# Patient Record
Sex: Female | Born: 1970 | Race: White | Hispanic: No | Marital: Married | Smoking: Never smoker
Health system: Southern US, Community
[De-identification: ages and names within clinical notes are randomized; demographics above are authoritative.]

## PROBLEM LIST (undated history)

## (undated) DIAGNOSIS — I10 Essential (primary) hypertension: Secondary | ICD-10-CM

## (undated) DIAGNOSIS — IMO0002 Reserved for concepts with insufficient information to code with codable children: Secondary | ICD-10-CM

## (undated) DIAGNOSIS — R87619 Unspecified abnormal cytological findings in specimens from cervix uteri: Secondary | ICD-10-CM

## (undated) HISTORY — PX: EYE SURGERY: SHX253

## (undated) HISTORY — DX: Unspecified abnormal cytological findings in specimens from cervix uteri: R87.619

## (undated) HISTORY — PX: CERVIX LESION DESTRUCTION: SHX591

## (undated) HISTORY — DX: Essential (primary) hypertension: I10

## (undated) HISTORY — DX: Reserved for concepts with insufficient information to code with codable children: IMO0002

---

## 1999-04-09 ENCOUNTER — Other Ambulatory Visit: Admission: RE | Admit: 1999-04-09 | Discharge: 1999-04-09 | Payer: Self-pay | Admitting: Family Medicine

## 2000-07-26 ENCOUNTER — Other Ambulatory Visit: Admission: RE | Admit: 2000-07-26 | Discharge: 2000-07-26 | Payer: Self-pay | Admitting: Family Medicine

## 2001-12-05 ENCOUNTER — Other Ambulatory Visit: Admission: RE | Admit: 2001-12-05 | Discharge: 2001-12-05 | Payer: Self-pay | Admitting: Family Medicine

## 2002-12-04 ENCOUNTER — Other Ambulatory Visit: Admission: RE | Admit: 2002-12-04 | Discharge: 2002-12-04 | Payer: Self-pay | Admitting: Internal Medicine

## 2003-11-29 ENCOUNTER — Other Ambulatory Visit: Admission: RE | Admit: 2003-11-29 | Discharge: 2003-11-29 | Payer: Self-pay | Admitting: Family Medicine

## 2004-12-17 ENCOUNTER — Other Ambulatory Visit: Admission: RE | Admit: 2004-12-17 | Discharge: 2004-12-17 | Payer: Self-pay | Admitting: Family Medicine

## 2005-12-21 ENCOUNTER — Other Ambulatory Visit: Admission: RE | Admit: 2005-12-21 | Discharge: 2005-12-21 | Payer: Self-pay | Admitting: Family Medicine

## 2010-07-20 ENCOUNTER — Ambulatory Visit (HOSPITAL_COMMUNITY)
Admission: RE | Admit: 2010-07-20 | Discharge: 2010-07-20 | Disposition: A | Discharge status: Home / Self Care | Payer: BC Managed Care – PPO | Source: Ambulatory Visit | Attending: Obstetrics and Gynecology | Admitting: Obstetrics and Gynecology

## 2010-07-20 DIAGNOSIS — N906 Unspecified hypertrophy of vulva: Secondary | ICD-10-CM | POA: Insufficient documentation

## 2010-07-20 HISTORY — PX: VULVA SURGERY: SHX837

## 2010-07-20 LAB — HCG, SERUM, QUALITATIVE: Preg, Serum: NEGATIVE

## 2010-07-20 LAB — CBC
MCH: 31.5 pg (ref 26.0–34.0)
MCV: 94.4 fL (ref 78.0–100.0)
Platelets: 362 10*3/uL (ref 150–400)
RBC: 4.66 MIL/uL (ref 3.87–5.11)
RDW: 12.5 % (ref 11.5–15.5)
WBC: 6.2 10*3/uL (ref 4.0–10.5)

## 2010-08-04 NOTE — Op Note (Signed)
  [name], [name]              ACCOUNT NO.:  [id number]  MEDICAL RECORD NO.:  [id number]           PATIENT TYPE:  O  LOCATION:  [facility]                          [facility]  PHYSICIAN:  Cynthia P. Romine, M.D.DATE OF BIRTH:  [birth year]  DATE OF PROCEDURE:  07/20/2010 DATE OF DISCHARGE:                              OPERATIVE REPORT   PREOPERATIVE DIAGNOSIS:  Bilateral hypertrophy of labia minora.  POSTOPERATIVE DIAGNOSIS:  Bilateral hypertrophy of labia minora.  PROCEDURE:  Revision of bilateral labia minora.  SURGEON:  Cynthia P. Romine, MD  ANESTHESIA:  General by LMA.  ESTIMATED BLOOD LOSS:  Minimal.  COMPLICATIONS:  None.  PROCEDURE:  The patient was taken to the operating room and after the induction of adequate general anesthesia by LMA, she was placed in dorsal lithotomy position and prepped and draped in the usual fashion. As she had emptied her bladder just before going back to the OR, a Foley catheter was not used.  In the holding room with the patient awake and before any medication was given to her, the surgeon marked the labia with a marking pen, where they were going to be excised and the patient agreed to the markings.  In the operating room after the prep and drape, the labia infiltrated at the incision site with 1% Xylocaine with epinephrine.  Mayo scissors were then used to excise along the lines and the patient and I had agreed upon preoperatively to reduce the size of the labia.  The right labia required significant reduction approximately 5 cm of length.  The labia was removed from the right.  On the left, it was more like 1-1/2 cm.  The incision lines were closed with a subcuticular stitch of 4-0 chromic.  Neosporin ointment was then placed over the incision line and the procedure was terminated.  The patient tolerated it well and went in satisfactory condition to postanesthesia recovery.     Cynthia P. Romine, M.D.     CPR/MEDQ  D:   07/20/2010  T:  07/21/2010  Job:  [id number]  Electronically Signed by Meredeth Ide M.D. on 08/04/2010 01:57:07 PM

## 2013-01-15 ENCOUNTER — Telehealth: Payer: Self-pay | Admitting: Certified Nurse Midwife

## 2013-01-15 ENCOUNTER — Other Ambulatory Visit: Payer: Self-pay | Admitting: Nurse Practitioner

## 2013-01-15 MED ORDER — NORGESTIM-ETH ESTRAD TRIPHASIC 0.18/0.215/0.25 MG-35 MCG PO TABS: 1.0000 | ORAL_TABLET | Freq: Every day | ORAL | Status: DC

## 2013-01-15 NOTE — Telephone Encounter (Signed)
Patient needs refill on birth control pills called in to [facility], [place] [phone]).  Currently on sugar pills in pack.

## 2013-01-15 NOTE — Telephone Encounter (Signed)
Patient is needing refills for her Tri Previfem BC to last her until her appointment 02/09/13 with Ms. Debbie.   Tri-Previfem # 1 pack sent through to [facility] patient is aware.

## 2013-01-15 NOTE — Telephone Encounter (Signed)
Patient has AEX 02/09/13 # 28/0rf's sent through to last patient.

## 2013-02-09 ENCOUNTER — Encounter: Payer: Self-pay | Admitting: Certified Nurse Midwife

## 2013-02-09 ENCOUNTER — Ambulatory Visit (INDEPENDENT_AMBULATORY_CARE_PROVIDER_SITE_OTHER): Payer: BC Managed Care – PPO | Admitting: Certified Nurse Midwife

## 2013-02-09 VITALS — BP 110/62 | HR 64 | Resp 16 | Ht 65.25 in | Wt 166.0 lb

## 2013-02-09 DIAGNOSIS — F5102 Adjustment insomnia: Secondary | ICD-10-CM

## 2013-02-09 DIAGNOSIS — Z0000 Encounter for general adult medical examination without abnormal findings: Secondary | ICD-10-CM

## 2013-02-09 DIAGNOSIS — Z309 Encounter for contraceptive management, unspecified: Secondary | ICD-10-CM

## 2013-02-09 DIAGNOSIS — Z01419 Encounter for gynecological examination (general) (routine) without abnormal findings: Secondary | ICD-10-CM

## 2013-02-09 LAB — POCT URINALYSIS DIPSTICK
Blood, UA: NEGATIVE
Glucose, UA: NEGATIVE
Nitrite, UA: NEGATIVE
Protein, UA: NEGATIVE
Urobilinogen, UA: NEGATIVE

## 2013-02-09 LAB — TSH: TSH: 1.448 u[IU]/mL (ref 0.350–4.500)

## 2013-02-09 MED ORDER — ZOLPIDEM TARTRATE 10 MG PO TABS: 10.0000 mg | ORAL_TABLET | Freq: Every evening | ORAL | Status: DC | PRN

## 2013-02-09 MED ORDER — NORGESTIM-ETH ESTRAD TRIPHASIC 0.18/0.215/0.25 MG-35 MCG PO TABS: 1.0000 | ORAL_TABLET | Freq: Every day | ORAL | Status: DC

## 2013-02-09 NOTE — Patient Instructions (Signed)

## 2013-02-09 NOTE — Progress Notes (Signed)
42 y.o. [id number] Married Caucasian Fe here for annual exam.  Periods normal with spotting 1-2 day prior to period one time only. Contraception working well. Traveled to [place] over the summer! Used the Ambien for sleep during travel, worked well. Needs refill for next trip. Experiences some right shoulder and neck pain since job position and extra hours. Feels it is work or lifting, plans to see PCP if not resolving. No other issues today Sees PCP for aex, labs every 2 years. All normal per patient in the past.  Patient's last menstrual period was 01/15/2013.          Sexually active: yes  The current method of family planning is OCP (estrogen/progesterone).    Exercising: yes  T25, Zumba & step Smoker:  no  Health Maintenance: Pap:  01-28-12 neg MMG:  5/14, f/u 6/14 rt breast negative Colonoscopy:  none BMD:   none TDaP:  2007 Labs: Poct urine-neg, Hgb-14.2 Self breast exam: done occ   reports that she has never smoked. She does not have any smokeless tobacco history on file. She reports that she does not drink alcohol or use illicit drugs.  Past Medical History  Diagnosis Date  . Abnormal Pap smear     Past Surgical History  Procedure Laterality Date  . Cervix lesion destruction      ?  Marland Kitchen Eye surgery    . Vulva surgery  07/20/10    bilateral revision labia minora    Current Outpatient Prescriptions  Medication Sig Dispense Refill  . Norgestimate-Ethinyl Estradiol Triphasic (TRI-PREVIFEM) 0.18/0.215/0.25 MG-35 MCG tablet Take 1 tablet by mouth daily.       No current facility-administered medications for this visit.    Family History  Problem Relation Age of Onset  . Heart disease Mother   . Heart disease Maternal Grandmother     pacemaker  . Stroke Maternal Grandmother   . Diabetes Maternal Aunt   . Cancer Paternal Grandfather     lung    ROS:  Pertinent items are noted in HPI.  Otherwise, a comprehensive ROS was negative.  Exam:   BP 110/62  Pulse 64  Resp 16   Ht 5' 5.25" (1.657 m)  Wt 166 lb (75.297 kg)  BMI 27.42 kg/m2  LMP 01/15/2013 Height: 5' 5.25" (165.7 cm)  Ht Readings from Last 3 Encounters:  02/09/13 5' 5.25" (1.657 m)    General appearance: alert, cooperative and appears stated age Head: Normocephalic, without obvious abnormality, atraumatic Neck: no adenopathy, supple, symmetrical, trachea midline and thyroid normal to inspection and palpation Lungs: clear to auscultation bilaterally Breasts: normal appearance, no masses or tenderness, No nipple retraction or dimpling, No nipple discharge or bleeding, No axillary or supraclavicular adenopathy Heart: regular rate and rhythm Abdomen: soft, non-tender; no masses,  no organomegaly Extremities: extremities normal, atraumatic, no cyanosis or edema Skin: Skin color, texture, turgor normal. No rashes or lesions Lymph nodes: Cervical, supraclavicular, and axillary nodes normal. No abnormal inguinal nodes palpated Neurologic: Grossly normal   Pelvic: External genitalia:  no lesions              Urethra:  normal appearing urethra with no masses, tenderness or lesions              Bartholin's and Skene's: normal                 Vagina: normal appearing vagina with normal color and discharge, no lesions  Cervix: normal, nontender              Pap taken: no Bimanual Exam:  Uterus:  normal size, contour, position, consistency, mobility, non-tender              Adnexa: normal adnexa and no mass, fullness, tenderness               Rectovaginal: Confirms               Anus:  normal sphincter tone, no lesions  A:  Well Woman with normal exam  Contraception OCP desires continuance  Interim insomnia with travel, uses Ambien 10 mg  Needs refill  History of abnormal pap smear 2000 with cryo, normal pap smear since cryo      P:   Reviewed health and wellness pertinent to exam  Rx Tri-Previfem see order  Rx Ambien see order  Lab:TSH, Vit. D  Pap smear as per  guidelines   Mammogram yearly pap smear not taken today  counseled on breast self exam, mammography screening, use and side effects of OCP's, adequate intake of calcium and vitamin D, diet and exercise  return annually or prn  An After Visit Summary was printed and given to the patient.

## 2013-02-13 NOTE — Progress Notes (Signed)
Note reviewed, agree with plan.  Makhai Fulco, MD  

## 2013-03-07 ENCOUNTER — Encounter: Payer: Self-pay | Admitting: Certified Nurse Midwife

## 2013-03-15 ENCOUNTER — Other Ambulatory Visit: Payer: Self-pay

## 2013-07-23 ENCOUNTER — Other Ambulatory Visit: Payer: Self-pay | Admitting: Certified Nurse Midwife

## 2013-07-23 NOTE — Telephone Encounter (Signed)
Last AEX and refill 02/2013 #30/0 refills Next appt 02/15/2014  Please approve or deny rx.

## 2013-10-05 ENCOUNTER — Ambulatory Visit (INDEPENDENT_AMBULATORY_CARE_PROVIDER_SITE_OTHER): Payer: BC Managed Care – PPO | Admitting: Family Medicine

## 2013-10-05 ENCOUNTER — Encounter: Payer: Self-pay | Admitting: Family Medicine

## 2013-10-05 VITALS — BP 134/75 | HR 74 | Temp 98.3°F | Wt 155.8 lb

## 2013-10-05 DIAGNOSIS — R55 Syncope and collapse: Secondary | ICD-10-CM

## 2013-10-05 DIAGNOSIS — F32A Depression, unspecified: Secondary | ICD-10-CM

## 2013-10-05 DIAGNOSIS — F329 Major depressive disorder, single episode, unspecified: Secondary | ICD-10-CM

## 2013-10-05 DIAGNOSIS — F3289 Other specified depressive episodes: Secondary | ICD-10-CM

## 2013-10-05 LAB — POCT CBC
Granulocyte percent: 77.6 %G (ref 37–80)
HCT, POC: 41.4 % (ref 37.7–47.9)
Hemoglobin: 13.5 g/dL (ref 12.2–16.2)
Lymph, poc: 1.9 (ref 0.6–3.4)
MCH, POC: 30.2 pg (ref 27–31.2)
MCHC: 32.6 g/dL (ref 31.8–35.4)
MCV: 92.5 fL (ref 80–97)
MPV: 8.4 fL (ref 0–99.8)
POC Granulocyte: 7.3 — AB (ref 2–6.9)
POC LYMPH PERCENT: 20 %L (ref 10–50)
Platelet Count, POC: 363 10*3/uL (ref 142–424)
RBC: 4.5 M/uL (ref 4.04–5.48)
RDW, POC: 12.5 %
WBC: 9.4 10*3/uL (ref 4.6–10.2)

## 2013-10-05 LAB — POCT URINALYSIS DIPSTICK
Bilirubin, UA: NEGATIVE
Glucose, UA: NEGATIVE
Ketones, UA: NEGATIVE
Leukocytes, UA: NEGATIVE
Nitrite, UA: NEGATIVE
Protein, UA: NEGATIVE
Spec Grav, UA: 1.01
Urobilinogen, UA: NEGATIVE
pH, UA: 6

## 2013-10-05 LAB — POCT UA - MICROSCOPIC ONLY
Bacteria, U Microscopic: NEGATIVE
Casts, Ur, LPF, POC: NEGATIVE
Crystals, Ur, HPF, POC: NEGATIVE
Mucus, UA: NEGATIVE
WBC, Ur, HPF, POC: NEGATIVE
Yeast, UA: NEGATIVE

## 2013-10-05 MED ORDER — ZOLPIDEM TARTRATE 10 MG PO TABS: 10.0000 mg | ORAL_TABLET | Freq: Every evening | ORAL | Status: DC | PRN

## 2013-10-05 MED ORDER — BUPROPION HCL ER (XL) 150 MG PO TB24: 150.0000 mg | ORAL_TABLET | Freq: Every day | ORAL | Status: DC

## 2013-10-05 NOTE — Progress Notes (Signed)
   Subjective:    Patient ID: Carla Woodard, female    DOB: 10-Jun-1970, 43 y.o.   MRN: [id number]  HPI This 43 y.o. female presents for evaluation of syncopal episode this am.  She gets leg cramps at night and at 3 am she woke up with a cramp and she got up and had a numbness and then she had to be helped up by her husband to the bathroom.  She states she was very thirsty and then she states after a few minutes the sx's stopped and she felt fine.  She gets stressed out a lot and she gets substernal chest discomfort at times.  She states she puts her stress in her chest.  She has started to smoke again after starting back again.     Review of Systems C/o syncope No chest pain, SOB, HA, dizziness, vision change, N/V, diarrhea, constipation, dysuria, urinary urgency or frequency, myalgias, arthralgias or rash.     Objective:   Physical Exam  Vital signs noted  Well developed well nourished female.  HEENT - Head atraumatic Normocephalic                Eyes - PERRLA, Conjuctiva - clear Sclera- Clear EOMI                Ears - EAC's Wnl TM's Wnl Gross Hearing WNL                Nose - Nares patent                 Throat - oropharanx wnl Respiratory - Lungs CTA bilateral Cardiac - RRR S1 and S2 without murmur GI - Abdomen soft Nontender and bowel sounds active x 4 Extremities - No edema. Neuro - Grossly intact.       Assessment & Plan:  Syncopal episodes - Plan: zolpidem (AMBIEN) 10 MG tablet, buPROPion (WELLBUTRIN XL) 150 MG 24 hr tablet, POCT CBC, CMP14+EGFR, Thyroid Panel With TSH, Ambulatory referral to Cardiology, POCT urinalysis dipstick, POCT UA - Microscopic Only  Sounds like it was vasovagal and advised her to go to ED if having anymore sx's.   Follow up in 2 weeks and prn  Depression - Plan: zolpidem (AMBIEN) 10 MG tablet, buPROPion (WELLBUTRIN XL) 150 MG 24 hr tablet  Leg cramps - Recommend she take tonic water qhs prn  Lysbeth Penner FNP

## 2013-10-06 LAB — CMP14+EGFR
ALT: 17 IU/L (ref 0–32)
AST: 24 IU/L (ref 0–40)
Albumin/Globulin Ratio: 2 (ref 1.1–2.5)
Albumin: 4.3 g/dL (ref 3.5–5.5)
Alkaline Phosphatase: 63 IU/L (ref 39–117)
BUN/Creatinine Ratio: 12 (ref 9–23)
BUN: 9 mg/dL (ref 6–24)
CO2: 25 mmol/L (ref 18–29)
Calcium: 9.6 mg/dL (ref 8.7–10.2)
Chloride: 98 mmol/L (ref 97–108)
Creatinine, Ser: 0.73 mg/dL (ref 0.57–1.00)
GFR calc Af Amer: 115 mL/min/{1.73_m2} (ref >59)
GFR calc non Af Amer: 100 mL/min/{1.73_m2} (ref >59)
Globulin, Total: 2.2 g/dL (ref 1.5–4.5)
Glucose: 88 mg/dL (ref 65–99)
Potassium: 4.7 mmol/L (ref 3.5–5.2)
Sodium: 139 mmol/L (ref 134–144)
Total Bilirubin: 0.3 mg/dL (ref 0.0–1.2)
Total Protein: 6.5 g/dL (ref 6.0–8.5)

## 2013-10-06 LAB — THYROID PANEL WITH TSH
Free Thyroxine Index: 2.4 (ref 1.2–4.9)
T3 Uptake Ratio: 21 % — ABNORMAL LOW (ref 24–39)
T4, Total: 11.5 ug/dL (ref 4.5–12.0)
TSH: 0.847 u[IU]/mL (ref 0.450–4.500)

## 2013-11-23 ENCOUNTER — Encounter: Payer: Self-pay | Admitting: Internal Medicine

## 2013-11-23 ENCOUNTER — Ambulatory Visit (INDEPENDENT_AMBULATORY_CARE_PROVIDER_SITE_OTHER): Payer: BC Managed Care – PPO | Admitting: Internal Medicine

## 2013-11-23 VITALS — BP 146/90 | HR 83 | Ht 65.0 in | Wt 159.0 lb

## 2013-11-23 DIAGNOSIS — F411 Generalized anxiety disorder: Secondary | ICD-10-CM

## 2013-11-23 DIAGNOSIS — G4700 Insomnia, unspecified: Secondary | ICD-10-CM

## 2013-11-23 DIAGNOSIS — R55 Syncope and collapse: Secondary | ICD-10-CM | POA: Insufficient documentation

## 2013-11-23 DIAGNOSIS — F419 Anxiety disorder, unspecified: Secondary | ICD-10-CM

## 2013-11-23 NOTE — Patient Instructions (Signed)
Your physician recommends that you schedule a follow-up appointment as needed  

## 2013-11-23 NOTE — Progress Notes (Signed)
OFFICE NOTE  Chief Complaint:  Passed-out  Primary Care Physician: Lysbeth Penner, FNP  HPI:  Carla Woodard is a pleasant 43 year old female who was referred to me by Dr. Sallyanne Havers for evaluation of recent syncope. According to Carla Woodard she has been having problems with leg cramps, particularly at night. Recently she had an episode of intense leg cramping pain in the middle the night. She became somewhat anxious and got up quickly to stand next to the bed. After standing up his side in bed for a few seconds she felt somewhat dizzy and lightheaded. She then started to walk over toward the bathroom and apparently syncopized. She next recalls her husband picking her up off the floor. Subsequently she felt hot, flushed and somewhat nauseated. She went back to bed and laid down and her symptoms improved. She reports a clear prodrome prior to the symptoms. She denies any palpitations, chest pain or other worrisome findings. She's had 2 other episodes of syncope in her life. Once when she was pregnant and another time when she had a urinary tract infection, fevers and was dehydrated. None of the episodes of been related with exertion. There is no family history of sudden cardiac death or other unusual cardiac symptoms.  PMHx:  Past Medical History  Diagnosis Date  . Abnormal Pap smear     Past Surgical History  Procedure Laterality Date  . Cervix lesion destruction      ?  Marland Kitchen Eye surgery    . Vulva surgery  07/20/10    bilateral revision labia minora    FAMHx:  Family History  Problem Relation Age of Onset  . Heart disease Mother   . Heart disease Maternal Grandmother     pacemaker  . Stroke Maternal Grandmother   . Diabetes Maternal Aunt   . Lung cancer Paternal Grandfather     SOCHx:   reports that she quit smoking about 2 months ago. Her smoking use included Cigarettes. She started smoking about 5 months ago. She smoked 0.50 packs per day. She does not have any smokeless  tobacco history on file. She reports that she drinks alcohol. She reports that she does not use illicit drugs.  ALLERGIES:  Allergies  Allergen Reactions  . Latex Rash    ROS: A comprehensive review of systems was negative except for: Cardiovascular: positive for syncope  HOME MEDS: Current Outpatient Prescriptions  Medication Sig Dispense Refill  . buPROPion (WELLBUTRIN XL) 150 MG 24 hr tablet Take 1 tablet (150 mg total) by mouth daily.  30 tablet  11  . Norgestimate-Ethinyl Estradiol Triphasic (TRI-PREVIFEM) 0.18/0.215/0.25 MG-35 MCG tablet Take 1 tablet by mouth daily.  3 Package  4  . zolpidem (AMBIEN) 10 MG tablet Take 5 mg by mouth at bedtime as needed for sleep.       No current facility-administered medications for this visit.    LABS/IMAGING: No results found for this or any previous visit (from the past 48 hour(s)). No results found.  VITALS: BP 146/90  Pulse 83  Ht 5\' 5"  (1.651 m)  Wt 159 lb (72.122 kg)  BMI 26.46 kg/m2  EXAM: General appearance: alert and no distress Neck: no carotid bruit and no JVD Lungs: clear to auscultation bilaterally Heart: regular rate and rhythm, S1, S2 normal, no murmur, click, rub or gallop Abdomen: soft, non-tender; bowel sounds normal; no masses,  no organomegaly Extremities: extremities normal, atraumatic, no cyanosis or edema Pulses: 2+ and symmetric Skin: Skin color, texture, turgor  normal. No rashes or lesions Neurologic: Grossly normal Psych: Normal  EKG: Sinus rhythm at 83 with mild sinus arrhythmia  ASSESSMENT: 1. Vasovagal syncope  PLAN: 1.   Mrs. Birnbaum describes what sounds like classic vasovagal syncope. She has few cardiac risk factors and there was a clear prodrome of symptoms which sound vaguely mediated prior to this episode. She has had other episodes of syncope in her life time with similar triggers. In order to help avoid these episodes in the future, I recommended adequate hydration and liberal salt  intake. In addition she should work on stress reduction and anxiety management as much as possible. Good sleep as well as exercise which she gets plenty of will be helpful as well.  I do not see any high risk cardiac features that would require further workup at this time.  Should she develop worsening palpitations or other concerning symptoms I am happy to see her back and we could do additional testing.  Thank you for consulting me.  Pixie Casino, MD, [facility] Attending Cardiologist [facility]  HILTY,Kenneth C 11/23/2013, 4:55 PM

## 2014-01-22 ENCOUNTER — Ambulatory Visit (INDEPENDENT_AMBULATORY_CARE_PROVIDER_SITE_OTHER): Payer: BC Managed Care – PPO | Admitting: Family

## 2014-01-22 ENCOUNTER — Encounter: Payer: Self-pay | Admitting: Family

## 2014-01-22 VITALS — BP 146/93 | HR 86 | Temp 98.3°F

## 2014-01-22 DIAGNOSIS — F411 Generalized anxiety disorder: Secondary | ICD-10-CM

## 2014-01-22 DIAGNOSIS — I10 Essential (primary) hypertension: Secondary | ICD-10-CM

## 2014-01-22 MED ORDER — HYDROCHLOROTHIAZIDE 12.5 MG PO TABS: 12.5000 mg | ORAL_TABLET | Freq: Every day | ORAL | Status: DC

## 2014-01-22 MED ORDER — ESCITALOPRAM OXALATE 20 MG PO TABS: 20.0000 mg | ORAL_TABLET | Freq: Every day | ORAL | Status: DC

## 2014-01-22 NOTE — Patient Instructions (Signed)
Generalized Anxiety Disorder Generalized anxiety disorder (GAD) is a mental disorder. It interferes with life functions, including relationships, work, and school. GAD is different from normal anxiety, which everyone experiences at some point in their lives in response to specific life events and activities. Normal anxiety actually helps [place] prepare for and get through these life events and activities. Normal anxiety goes away after the event or activity is over.  GAD causes anxiety that is not necessarily related to specific events or activities. It also causes excess anxiety in proportion to specific events or activities. The anxiety associated with GAD is also difficult to control. GAD can vary from mild to severe. People with severe GAD can have intense waves of anxiety with physical symptoms (panic attacks).  SYMPTOMS The anxiety and worry associated with GAD are difficult to control. This anxiety and worry are related to many life events and activities and also occur more days than not for 6 months or longer. People with GAD also have three or more of the following symptoms (one or more in children):  Restlessness.   Fatigue.  Difficulty concentrating.   Irritability.  Muscle tension.  Difficulty sleeping or unsatisfying sleep. DIAGNOSIS GAD is diagnosed through an assessment by your health care provider. Your health care provider will ask you questions aboutyour mood,physical symptoms, and events in your life. Your health care provider may ask you about your medical history and use of alcohol or drugs, including prescription medicines. Your health care provider may also do a physical exam and blood tests. Certain medical conditions and the use of certain substances can cause symptoms similar to those associated with GAD. Your health care provider may refer you to a mental health specialist for further evaluation. TREATMENT The following therapies are usually used to treat GAD:    Medication. Antidepressant medication usually is prescribed for long-term daily control. Antianxiety medicines may be added in severe cases, especially when panic attacks occur.   Talk therapy (psychotherapy). Certain types of talk therapy can be helpful in treating GAD by providing support, education, and guidance. A form of talk therapy called cognitive behavioral therapy can teach you healthy ways to think about and react to daily life events and activities.  Stress managementtechniques. These include yoga, meditation, and exercise and can be very helpful when they are practiced regularly. A mental health specialist can help determine which treatment is best for you. Some people see improvement with one therapy. However, other people require a combination of therapies. Document Released: 08/21/2012 Document Revised: 09/10/2013 Document Reviewed: 08/21/2012 [facility] Patient Information 2015 [place], [place]. This information is not intended to replace advice given to you by your health care provider. Make sure you discuss any questions you have with your health care provider. Hypertension Hypertension, commonly called high blood pressure, is when the force of blood pumping through your arteries is too strong. Your arteries are the blood vessels that carry blood from your heart throughout your body. A blood pressure reading consists of a higher number over a lower number, such as 110/72. The higher number (systolic) is the pressure inside your arteries when your heart pumps. The lower number (diastolic) is the pressure inside your arteries when your heart relaxes. Ideally you want your blood pressure below 120/80. Hypertension forces your heart to work harder to pump blood. Your arteries may become narrow or stiff. Having hypertension puts you at risk for heart disease, stroke, and other problems.  RISK FACTORS Some risk factors for high blood pressure are controllable. Others  are not.  Risk  factors you cannot control include:   Race. You may be at higher risk if you are African American.  Age. Risk increases with age.  Gender. Men are at higher risk than women before age 69 years. After age 20, women are at higher risk than men. Risk factors you can control include:  Not getting enough exercise or physical activity.  Being overweight.  Getting too much fat, sugar, calories, or salt in your diet.  Drinking too much alcohol. SIGNS AND SYMPTOMS Hypertension does not usually cause signs or symptoms. Extremely high blood pressure (hypertensive crisis) may cause headache, anxiety, shortness of breath, and nosebleed. DIAGNOSIS  To check if you have hypertension, your health care provider will measure your blood pressure while you are seated, with your arm held at the level of your heart. It should be measured at least twice using the same arm. Certain conditions can cause a difference in blood pressure between your right and left arms. A blood pressure reading that is higher than normal on one occasion does not mean that you need treatment. If one blood pressure reading is high, ask your health care provider about having it checked again. TREATMENT  Treating high blood pressure includes making lifestyle changes and possibly taking medicine. Living a healthy lifestyle can help lower high blood pressure. You may need to change some of your habits. Lifestyle changes may include:  Following the DASH diet. This diet is high in fruits, vegetables, and whole grains. It is low in salt, red meat, and added sugars.  Getting at least 2 hours of brisk physical activity every week.  Losing weight if necessary.  Not smoking.  Limiting alcoholic beverages.  Learning ways to reduce stress. If lifestyle changes are not enough to get your blood pressure under control, your health care provider may prescribe medicine. You may need to take more than one. Work closely with your health care  provider to understand the risks and benefits. HOME CARE INSTRUCTIONS  Have your blood pressure rechecked as directed by your health care provider.   Take medicines only as directed by your health care provider. Follow the directions carefully. Blood pressure medicines must be taken as prescribed. The medicine does not work as well when you skip doses. Skipping doses also puts you at risk for problems.   Do not smoke.   Monitor your blood pressure at home as directed by your health care provider. SEEK MEDICAL CARE IF:   You think you are having a reaction to medicines taken.  You have recurrent headaches or feel dizzy.  You have swelling in your ankles.  You have trouble with your vision. SEEK IMMEDIATE MEDICAL CARE IF:  You develop a severe headache or confusion.  You have unusual weakness, numbness, or feel faint.  You have severe chest or abdominal pain.  You vomit repeatedly.  You have trouble breathing. MAKE SURE YOU:   Understand these instructions.  Will watch your condition.  Will get help right away if you are not doing well or get worse. Document Released: 04/26/2005 Document Revised: 09/10/2013 Document Reviewed: 02/16/2013 [facility] Patient Information 2015 [place], [place]. This information is not intended to replace advice given to you by your health care provider. Make sure you discuss any questions you have with your health care provider.

## 2014-01-22 NOTE — Progress Notes (Signed)
   Subjective:    Patient ID: Carla Woodard, female    DOB: September 04, 1970, 43 y.o.   MRN: [id number]  Hypertension This is a new problem. The current episode started more than 1 month ago. The problem has been waxing and waning since onset. The problem is uncontrolled. Associated symptoms include anxiety, headaches, malaise/fatigue and palpitations. Pertinent negatives include no blurred vision, peripheral edema or shortness of breath. Past treatments include nothing. The current treatment provides mild improvement. There is no history of kidney disease, CAD/MI, CVA, heart failure or a thyroid problem. There is no history of sleep apnea.      Review of Systems  Constitutional: Positive for malaise/fatigue.  Eyes: Negative.  Negative for blurred vision.  Respiratory: Negative.  Negative for shortness of breath.   Cardiovascular: Positive for palpitations.  Gastrointestinal: Negative.   Endocrine: Negative.   Genitourinary: Negative.   Musculoskeletal: Negative.   Neurological: Positive for headaches.  Hematological: Negative.   Psychiatric/Behavioral: Negative.   All other systems reviewed and are negative.      Objective:   Physical Exam  Vitals reviewed. Constitutional: She is oriented to person, place, and time. She appears well-developed and well-nourished. No distress.  HENT:  Head: Normocephalic and atraumatic.  Right Ear: External ear normal.  Mouth/Throat: Oropharynx is clear and moist.  Eyes: Pupils are equal, round, and reactive to light.  Neck: Normal range of motion. Neck supple. No thyromegaly present.  Cardiovascular: Normal rate, regular rhythm, normal heart sounds and intact distal pulses.   No murmur heard. Pulmonary/Chest: Effort normal and breath sounds normal. No respiratory distress. She has no wheezes.  Abdominal: Soft. Bowel sounds are normal. She exhibits no distension. There is no tenderness.  Musculoskeletal: Normal range of motion. She exhibits no  edema and no tenderness.  Neurological: She is alert and oriented to person, place, and time. She has normal reflexes. No cranial nerve deficit.  Skin: Skin is warm and dry.  Psychiatric: Her behavior is normal. Judgment and thought content normal. She exhibits a depressed mood.  Tearful      BP 146/93  Pulse 86  Temp(Src) 98.3 F (36.8 C) (Oral)  LMP 01/19/2014     Assessment & Plan:  1. GAD (generalized anxiety disorder) -Stress management - escitalopram (LEXAPRO) 20 MG tablet; Take 1 tablet (20 mg total) by mouth daily.  Dispense: 90 tablet; Refill: 3  2. Essential hypertension, benign --Daily blood pressure log given with instructions on how to fill out and told to bring to next visit -Dash diet information given -Exercise encouraged - Stress Management  -Continue current meds -RTO in 2 weeks - hydrochlorothiazide (HYDRODIURIL) 12.5 MG tablet; Take 1 tablet (12.5 mg total) by mouth daily.  Dispense: 90 tablet; Refill: [place], FNP

## 2014-02-15 ENCOUNTER — Ambulatory Visit (INDEPENDENT_AMBULATORY_CARE_PROVIDER_SITE_OTHER): Payer: BC Managed Care – PPO | Admitting: Certified Nurse Midwife

## 2014-02-15 ENCOUNTER — Encounter: Payer: Self-pay | Admitting: Certified Nurse Midwife

## 2014-02-15 VITALS — BP 110/60 | HR 64 | Resp 16 | Ht 64.75 in | Wt 162.0 lb

## 2014-02-15 DIAGNOSIS — Z01419 Encounter for gynecological examination (general) (routine) without abnormal findings: Secondary | ICD-10-CM

## 2014-02-15 DIAGNOSIS — I10 Essential (primary) hypertension: Secondary | ICD-10-CM

## 2014-02-15 DIAGNOSIS — Z3041 Encounter for surveillance of contraceptive pills: Secondary | ICD-10-CM

## 2014-02-15 DIAGNOSIS — Z124 Encounter for screening for malignant neoplasm of cervix: Secondary | ICD-10-CM

## 2014-02-15 MED ORDER — NORETHINDRONE 0.35 MG PO TABS: 1.0000 | ORAL_TABLET | Freq: Every day | ORAL | Status: DC

## 2014-02-15 NOTE — Progress Notes (Signed)
Reviewed personally.  M. Suzanne Amylee Lodato, MD.  

## 2014-02-15 NOTE — Progress Notes (Signed)
43 y.o. [id number] Married Caucasian Fe here for annual exam. Periods normal, no issues. Contraception working well. Patient has been under stress with spouse's job and frequent phone calls. Patient has been seen by cardiology and PCP. Now on HCTZ for hypertension with PCP management. Cardiology evaluation sinus arrythmia . Stress much better now. 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  PCP manages all labs and medication. No other health issues today.  Patient's last menstrual period was 02/11/2014.          Sexually active: Yes.    The current method of family planning is OCP (estrogen/progesterone).    Exercising: Yes.    total gym, step Smoker:  no  Health Maintenance: Pap:  01-28-12 neg HPV HR neg MMG:  10-02-13 neg Colonoscopy:  none BMD:   none TDaP:  2007 Labs: none Self breast exam: not done   reports that she quit smoking about 4 months ago. Her smoking use included Cigarettes. She started smoking about 8 months ago. She smoked 0.50 packs per day. She does not have any smokeless tobacco history on file. She reports that she drinks alcohol. She reports that she does not use illicit drugs.  Past Medical History  Diagnosis Date  . Abnormal Pap smear   . Hypertension     Past Surgical History  Procedure Laterality Date  . Cervix lesion destruction      ?  Marland Kitchen Eye surgery    . Vulva surgery  07/20/10    bilateral revision labia minora    Current Outpatient Prescriptions  Medication Sig Dispense Refill  . hydrochlorothiazide (HYDRODIURIL) 12.5 MG tablet Take 1 tablet (12.5 mg total) by mouth daily.  90 tablet  3  . Norgestimate-Ethinyl Estradiol Triphasic (TRI-PREVIFEM) 0.18/0.215/0.25 MG-35 MCG tablet Take 1 tablet by mouth daily.  3 Package  4  . zolpidem (AMBIEN) 10 MG tablet Take 5 mg by mouth at bedtime as needed for sleep.       No current facility-administered medications for this visit.    Family History  Problem Relation Age of Onset  . Heart disease Mother   . Cirrhosis  Mother   . Heart disease Maternal Grandmother     pacemaker  . Stroke Maternal Grandmother   . Diabetes Maternal Aunt   . Lung cancer Paternal Grandfather     ROS:  Pertinent items are noted in HPI.  Otherwise, a comprehensive ROS was negative.  Exam:   BP 110/60  Pulse 64  Resp 16  Ht 5' 4.75" (1.645 m)  Wt 162 lb (73.483 kg)  BMI 27.16 kg/m2  LMP 02/11/2014 Height: 5' 4.75" (164.5 cm)  Ht Readings from Last 3 Encounters:  02/15/14 5' 4.75" (1.645 m)  11/23/13 5\' 5"  (1.651 m)  02/09/13 5' 5.25" (1.657 m)    General appearance: alert, cooperative and appears stated age Head: Normocephalic, without obvious abnormality, atraumatic Neck: no adenopathy, supple, symmetrical, trachea midline and thyroid normal to inspection and palpation Lungs: clear to auscultation bilaterally Breasts: normal appearance, no masses or tenderness, No nipple retraction or dimpling, No nipple discharge or bleeding, No axillary or supraclavicular adenopathy Heart: regular rate and rhythm Abdomen: soft, non-tender; no masses,  no organomegaly Extremities: extremities normal, atraumatic, no cyanosis or edema Skin: Skin color, texture, turgor normal. No rashes or lesions Lymph nodes: Cervical, supraclavicular, and axillary nodes normal. No abnormal inguinal nodes palpated Neurologic: Grossly normal   Pelvic: External genitalia:  no lesions              Urethra:  normal appearing urethra with no masses, tenderness or lesions              Bartholin's and Skene's: normal                 Vagina: normal appearing vagina with normal color and discharge, no lesions              Cervix: normal, non tender              Pap taken: Yes.   Bimanual Exam:  Uterus:  normal size, contour, position, consistency, mobility, non-tender and anteverted              Adnexa: normal adnexa and no mass, fullness, tenderness               Rectovaginal: Confirms               Anus:  normal sphincter tone, no lesions  A:   Well Woman with normal exam  Contraception OCP desired  Recent cardiac evaluation with mild sinus arrythmia  Hypertension now on medication  P:   Reviewed health and wellness pertinent to exam  Discussed need to change to Progesterone only contraception due to hypertension and cardiac evaluation. Patient agreeable for pills only  Rx Micronor with instructions and bleeding profile expectations  Pap smear taken today with HPV reflex   counseled on breast self exam, mammography screening, adequate intake of calcium and vitamin D, diet and exercise  return annually or prn  An After Visit Summary was printed and given to the patient.

## 2014-02-15 NOTE — Patient Instructions (Signed)

## 2014-02-20 LAB — IPS PAP TEST WITH REFLEX TO HPV

## 2014-03-11 ENCOUNTER — Encounter: Payer: Self-pay | Admitting: Certified Nurse Midwife

## 2014-03-15 ENCOUNTER — Ambulatory Visit: Payer: BC Managed Care – PPO | Admitting: Certified Nurse Midwife

## 2014-03-15 ENCOUNTER — Telehealth: Payer: Self-pay | Admitting: Certified Nurse Midwife

## 2014-03-15 NOTE — Telephone Encounter (Signed)
Patient called to reschedule her appointment for today for a repeat pap due to starting her menstrual cycle. She rescheduled to 03/22/14. FYI only.

## 2014-03-22 ENCOUNTER — Ambulatory Visit: Payer: BC Managed Care – PPO | Admitting: Certified Nurse Midwife

## 2014-04-07 ENCOUNTER — Other Ambulatory Visit: Payer: Self-pay | Admitting: Certified Nurse Midwife

## 2014-04-08 ENCOUNTER — Other Ambulatory Visit: Payer: Self-pay | Admitting: Family Medicine

## 2014-04-08 NOTE — Telephone Encounter (Signed)
02/15/14 patient was switched to Micronor, patient is no longer on this [facility]

## 2014-04-09 ENCOUNTER — Other Ambulatory Visit: Payer: Self-pay | Admitting: Family Medicine

## 2014-04-09 MED ORDER — ZOLPIDEM TARTRATE 10 MG PO TABS: 5.0000 mg | ORAL_TABLET | Freq: Every evening | ORAL | Status: DC | PRN

## 2014-04-09 NOTE — Telephone Encounter (Signed)
Aware,script for [facility] ready.

## 2014-07-12 ENCOUNTER — Other Ambulatory Visit: Payer: Self-pay | Admitting: Family Medicine

## 2014-07-12 NOTE — Telephone Encounter (Signed)
rx called into pharmacy

## 2014-07-12 NOTE — Telephone Encounter (Signed)
Last seen 01/22/14, last filled 05/23/14. Route to pool, nurse call into [facility]

## 2014-08-15 ENCOUNTER — Telehealth: Payer: Self-pay | Admitting: Certified Nurse Midwife

## 2014-08-15 NOTE — Telephone Encounter (Signed)
Message left to return call to Carla Woodard at [phone].    

## 2014-08-15 NOTE — Telephone Encounter (Signed)
Pt states Carla Woodard changed her birth control recently taking the hormone out of it. Pt states she was always regular for 27 years. Pt states her cycle is irregular - cycle for a week, stop for a couple of days and start again for a couple of days - flow is irregular between normal to heavy and spotting. Pt states shes not sure if menopause is beginning, but she is having acne and headaches with the irregular cycles. Pt is going out of town tomorrow (Friday) morning, but would appreciate a call to her cell phone to discuss. Pt states available late Monday afternoon if an office visit is needed.

## 2014-08-16 NOTE — Telephone Encounter (Signed)
Spoke with patient. Patient states that she is currently taking Micronor since last aex on 02/15/2014 with Regina Eck CNM. "She had to switch me because I was having these fainting spells and I went to a cardiologist and there was something minor wrong. Since being on this pill my periods have been off. I was late one month and it was heavy and this month I started on time and then stopped after. I was more irritable and had a really bad headache this month too." Patient denies missing any pills or taking any pills late. Advised patient will need to be seen with provider in office to discuss. Patient is agreeable. Requesting Monday afternoon appointment as she will be traveling through [place] at that time. Appointment scheduled for Monday 4/11 at 3:30pm with .Milford Cage, FNP. Patient is agreeable to date, time, and to see another provider.   Routing to provider for final review. Patient agreeable to disposition. Will close encounter

## 2014-08-16 NOTE — Telephone Encounter (Signed)
Pt returning call. Will be at work until 60 today.

## 2014-08-19 ENCOUNTER — Encounter: Payer: Self-pay | Admitting: Nurse Practitioner

## 2014-08-19 ENCOUNTER — Ambulatory Visit (INDEPENDENT_AMBULATORY_CARE_PROVIDER_SITE_OTHER): Payer: BC Managed Care – PPO | Admitting: Nurse Practitioner

## 2014-08-19 VITALS — BP 112/80 | HR 64 | Resp 16 | Ht 64.75 in | Wt 163.0 lb

## 2014-08-19 DIAGNOSIS — Z0000 Encounter for general adult medical examination without abnormal findings: Secondary | ICD-10-CM | POA: Diagnosis not present

## 2014-08-19 DIAGNOSIS — N926 Irregular menstruation, unspecified: Secondary | ICD-10-CM | POA: Diagnosis not present

## 2014-08-19 NOTE — Progress Notes (Signed)
Subjective:     Patient ID: Carla Woodard, female   DOB: [birth year], 44 y.o.   MRN: [id number]  HPI  This 44 yo WMF went off OCP secondary to hypertension in the fall.  She feels most of this was related to stress.  She was changed to POP.  Since then menses are irregular with heavy days or very light days.  She is getting 2 cycles per month,  one is very light for just a few days and one is heavier like a regular cycle with associated PMS.  She is not compliant to POP on the weekends. She is very busy with her husband and her schedule is not regular - she thinks this is most likely adding to the irregular spotting.  She was also here for AEX 02/15/14 and was having heavy vaginal bleeding that obscured cells on the pap.  She needs a repeat pap today.  She really liked the OCP for 27 years and is adjusting to the POP but does not like it as well.     Review of Systems  Constitutional: Negative for fatigue.  Respiratory: Negative.  Negative for chest tightness and shortness of breath.   Cardiovascular: Negative.  Negative for chest pain.  Gastrointestinal: Negative.   Genitourinary: Positive for vaginal bleeding.       As noted irregular menses  Neurological: Negative.  Negative for dizziness and light-headedness.  Psychiatric/Behavioral: Negative.        Objective:   Physical Exam  Constitutional: She is oriented to person, place, and time. She appears well-developed and well-nourished. No distress.  Abdominal: Soft. She exhibits no distension. There is no tenderness. There is no rebound.  Genitourinary:  Normal vaginal discharge.  Pap is collected.  No mass or uterine enlargement.  Musculoskeletal: Normal range of motion.  Neurological: She is alert and oriented to person, place, and time.  Psychiatric: She has a normal mood and affect. Her behavior is normal. Judgment and thought content normal.       Assessment:     AUB on POP maybe due to non compliance issues Repeat pap due to  insufficient cells    Plan:     Will follow with pap Discussed compliance issues with POP and she must do better - discussed ways to help her remember on the weekends. Also discussed other reasons of AUB including endo polyp and uterine fibroids - if she is compliant and still has AUB will need PUS.  She is in agreement and will keep [place] informed. Discussed the possibility of pregnancy issues with non compliance To follow with AEX with Ms. Debbie in October.

## 2014-08-20 NOTE — Patient Instructions (Signed)
Will follow with pap Call us if continued abnormal bleeding

## 2014-08-22 LAB — IPS PAP TEST WITH HPV

## 2014-08-26 NOTE — Progress Notes (Signed)
Encounter reviewed by Dr. Brook Silva.  

## 2014-08-28 LAB — HEMOGLOBIN, FINGERSTICK: Hemoglobin, fingerstick: 14.1 g/dL (ref 12.0–16.0)

## 2014-11-12 ENCOUNTER — Ambulatory Visit (INDEPENDENT_AMBULATORY_CARE_PROVIDER_SITE_OTHER): Payer: BC Managed Care – PPO | Admitting: Family

## 2014-11-12 ENCOUNTER — Encounter: Payer: Self-pay | Admitting: Family

## 2014-11-12 VITALS — BP 119/79 | HR 73 | Temp 98.8°F | Ht 64.75 in | Wt 159.0 lb

## 2014-11-12 DIAGNOSIS — J029 Acute pharyngitis, unspecified: Secondary | ICD-10-CM

## 2014-11-12 DIAGNOSIS — J069 Acute upper respiratory infection, unspecified: Secondary | ICD-10-CM | POA: Diagnosis not present

## 2014-11-12 LAB — POCT RAPID STREP A (OFFICE): RAPID STREP A SCREEN: NEGATIVE

## 2014-11-12 MED ORDER — HYDROCODONE-HOMATROPINE 5-1.5 MG/5ML PO SYRP: 5.0000 mL | ORAL_SOLUTION | Freq: Three times a day (TID) | ORAL | Status: DC | PRN

## 2014-11-12 MED ORDER — BENZONATATE 200 MG PO CAPS: 200.0000 mg | ORAL_CAPSULE | Freq: Three times a day (TID) | ORAL | Status: DC | PRN

## 2014-11-12 MED ORDER — METHYLPREDNISOLONE 4 MG PO TBPK: ORAL_TABLET | ORAL | Status: DC

## 2014-11-12 NOTE — Patient Instructions (Signed)
Upper Respiratory Infection, Adult An upper respiratory infection (URI) is also sometimes known as the common cold. The upper respiratory tract includes the nose, sinuses, throat, trachea, and bronchi. Bronchi are the airways leading to the lungs. Most people improve within 1 week, but symptoms can last up to 2 weeks. A residual cough may last even longer.  CAUSES Many different viruses can infect the tissues lining the upper respiratory tract. The tissues become irritated and inflamed and often become very moist. Mucus production is also common. A cold is contagious. You can easily spread the virus to others by oral contact. This includes kissing, sharing a glass, coughing, or sneezing. Touching your mouth or nose and then touching a surface, which is then touched by another person, can also spread the virus. SYMPTOMS  Symptoms typically develop 1 to 3 days after you come in contact with a cold virus. Symptoms vary from person to person. They may include:  Runny nose.  Sneezing.  Nasal congestion.  Sinus irritation.  Sore throat.  Loss of voice (laryngitis).  Cough.  Fatigue.  Muscle aches.  Loss of appetite.  Headache.  Low-grade fever. DIAGNOSIS  You might diagnose your own cold based on familiar symptoms, since most people get a cold 2 to 3 times a year. Your caregiver can confirm this based on your exam. Most importantly, your caregiver can check that your symptoms are not due to another disease such as strep throat, sinusitis, pneumonia, asthma, or epiglottitis. Blood tests, throat tests, and X-rays are not necessary to diagnose a common cold, but they may sometimes be helpful in excluding other more serious diseases. Your caregiver will decide if any further tests are required. RISKS AND COMPLICATIONS  You may be at risk for a more severe case of the common cold if you smoke cigarettes, have chronic heart disease (such as heart failure) or lung disease (such as asthma), or if  you have a weakened immune system. The very young and very old are also at risk for more serious infections. Bacterial sinusitis, middle ear infections, and bacterial pneumonia can complicate the common cold. The common cold can worsen asthma and chronic obstructive pulmonary disease (COPD). Sometimes, these complications can require emergency medical care and may be life-threatening. PREVENTION  The best way to protect against getting a cold is to practice good hygiene. Avoid oral or hand contact with people with cold symptoms. Wash your hands often if contact occurs. There is no clear evidence that vitamin C, vitamin E, echinacea, or exercise reduces the chance of developing a cold. However, it is always recommended to get plenty of rest and practice good nutrition. TREATMENT  Treatment is directed at relieving symptoms. There is no cure. Antibiotics are not effective, because the infection is caused by a virus, not by bacteria. Treatment may include:  Increased fluid intake. Sports drinks offer valuable electrolytes, sugars, and fluids.  Breathing heated mist or steam (vaporizer or shower).  Eating chicken soup or other clear broths, and maintaining good nutrition.  Getting plenty of rest.  Using gargles or lozenges for comfort.  Controlling fevers with ibuprofen or acetaminophen as directed by your caregiver.  Increasing usage of your inhaler if you have asthma. Zinc gel and zinc lozenges, taken in the first 24 hours of the common cold, can shorten the duration and lessen the severity of symptoms. Pain medicines may help with fever, muscle aches, and throat pain. A variety of non-prescription medicines are available to treat congestion and runny nose. Your caregiver   can make recommendations and may suggest nasal or lung inhalers for other symptoms.  HOME CARE INSTRUCTIONS   Only take over-the-counter or prescription medicines for pain, discomfort, or fever as directed by your  caregiver.  Use a warm mist humidifier or inhale steam from a shower to increase air moisture. This may keep secretions moist and make it easier to breathe.  Drink enough water and fluids to keep your urine clear or pale yellow.  Rest as needed.  Return to work when your temperature has returned to normal or as your caregiver advises. You may need to stay home longer to avoid infecting others. You can also use a face mask and careful hand washing to prevent spread of the virus. SEEK MEDICAL CARE IF:   After the first few days, you feel you are getting worse rather than better.  You need your caregiver's advice about medicines to control symptoms.  You develop chills, worsening shortness of breath, or brown or red sputum. These may be signs of pneumonia.  You develop yellow or brown nasal discharge or pain in the face, especially when you bend forward. These may be signs of sinusitis.  You develop a fever, swollen neck glands, pain with swallowing, or white areas in the back of your throat. These may be signs of strep throat. SEEK IMMEDIATE MEDICAL CARE IF:   You have a fever.  You develop severe or persistent headache, ear pain, sinus pain, or chest pain.  You develop wheezing, a prolonged cough, cough up blood, or have a change in your usual mucus (if you have chronic lung disease).  You develop sore muscles or a stiff neck. Document Released: 10/20/2000 Document Revised: 07/19/2011 Document Reviewed: 08/01/2013 ExitCare Patient Information 2015 [employer]. This information is not intended to replace advice given to you by your health care provider. Make sure you discuss any questions you have with your health care provider.  - Take meds as prescribed - Use a cool mist humidifier  -Use saline nose sprays frequently -Saline irrigations of the nose can be very helpful if done frequently.  * 4X daily for 1 week*  * Use of a nettie pot can be helpful with this. Follow  directions with this* -Force fluids -For any cough or congestion  Use plain Mucinex- regular strength or max strength is fine   * Children- consult with Pharmacist for dosing -For fever or aces or pains- take tylenol or ibuprofen appropriate for age and weight.  * for fevers greater than 101 orally you may alternate ibuprofen and tylenol every  3 hours. -Throat lozenges if help -New toothbrush in 3 days   Miliani Deike, FNP  

## 2014-11-12 NOTE — Progress Notes (Signed)
Subjective:    Patient ID: Carla Woodard, female    DOB: 07-Apr-1971, 44 y.o.   MRN: [id number]  Sore Throat  This is a new problem. The current episode started in the past 7 days. The problem has been gradually worsening. There has been no fever. The pain is at a severity of 5/10. The pain is moderate. Associated symptoms include congestion, coughing, headaches, a hoarse voice, swollen glands and trouble swallowing. Pertinent negatives include no ear discharge, ear pain, plugged ear sensation or shortness of breath. She has had no exposure to strep. She has tried acetaminophen and NSAIDs for the symptoms. The treatment provided mild relief.      Review of Systems  Constitutional: Negative.   HENT: Positive for congestion, hoarse voice and trouble swallowing. Negative for ear discharge and ear pain.   Eyes: Negative.   Respiratory: Positive for cough. Negative for shortness of breath.   Cardiovascular: Negative.  Negative for palpitations.  Gastrointestinal: Negative.   Endocrine: Negative.   Genitourinary: Negative.   Musculoskeletal: Negative.   Neurological: Positive for headaches.  Hematological: Negative.   Psychiatric/Behavioral: Negative.   All other systems reviewed and are negative.      Objective:   Physical Exam  Constitutional: She is oriented to person, place, and time. She appears well-developed and well-nourished. No distress.  HENT:  Head: Normocephalic and atraumatic.  Right Ear: External ear normal.  Oropharynx erythemas  Eyes: Pupils are equal, round, and reactive to light.  Neck: Normal range of motion. Neck supple. No thyromegaly present.  Cardiovascular: Normal rate, regular rhythm, normal heart sounds and intact distal pulses.   No murmur heard. Pulmonary/Chest: Effort normal and breath sounds normal. No respiratory distress. She has no wheezes.  Abdominal: Soft. Bowel sounds are normal. She exhibits no distension. There is no tenderness.    Musculoskeletal: Normal range of motion. She exhibits no edema or tenderness.  Neurological: She is alert and oriented to person, place, and time. She has normal reflexes. No cranial nerve deficit.  Skin: Skin is warm and dry.  Psychiatric: She has a normal mood and affect. Her behavior is normal. Judgment and thought content normal.  Vitals reviewed.    BP 119/79 mmHg  Pulse 73  Temp(Src) 98.8 F (37.1 C) (Oral)  Ht 5' 4.75" (1.645 m)  Wt 159 lb (72.122 kg)  BMI 26.65 kg/m2      Assessment & Plan:  1. Sore throat - POCT rapid strep A  2. Acute upper respiratory infection -- Take meds as prescribed - Use a cool mist humidifier  -Use saline nose sprays frequently -Saline irrigations of the nose can be very helpful if done frequently.  * 4X daily for 1 week*  * Use of a nettie pot can be helpful with this. Follow directions with this* -Force fluids -For any cough or congestion  Use plain Mucinex- regular strength or max strength is fine   * Children- consult with Pharmacist for dosing -For fever or aces or pains- take tylenol or ibuprofen appropriate for age and weight.  * for fevers greater than 101 orally you may alternate ibuprofen and tylenol every  3 hours. -Throat lozenges if help -New toothbrush in 3 days - benzonatate (TESSALON) 200 MG capsule; Take 1 capsule (200 mg total) by mouth 3 (three) times daily as needed.  Dispense: 30 capsule; Refill: 1 - HYDROcodone-homatropine (HYCODAN) 5-1.5 MG/5ML syrup; Take 5 mLs by mouth every 8 (eight) hours as needed for cough.  Dispense: 120 mL;  Refill: 0 - methylPREDNISolone (MEDROL DOSEPAK) 4 MG TBPK tablet; Use as directed  Dispense: 21 tablet; Refill: 0  Evelina Dun, FNP

## 2015-01-10 ENCOUNTER — Ambulatory Visit (INDEPENDENT_AMBULATORY_CARE_PROVIDER_SITE_OTHER): Payer: BC Managed Care – PPO | Admitting: Family Medicine

## 2015-01-10 ENCOUNTER — Encounter: Payer: Self-pay | Admitting: Family Medicine

## 2015-01-10 VITALS — BP 111/77 | HR 70 | Temp 99.3°F | Ht 64.75 in | Wt 158.8 lb

## 2015-01-10 DIAGNOSIS — I10 Essential (primary) hypertension: Secondary | ICD-10-CM | POA: Diagnosis not present

## 2015-01-10 DIAGNOSIS — G4700 Insomnia, unspecified: Secondary | ICD-10-CM | POA: Diagnosis not present

## 2015-01-10 DIAGNOSIS — Z13220 Encounter for screening for lipoid disorders: Secondary | ICD-10-CM

## 2015-01-10 DIAGNOSIS — Z131 Encounter for screening for diabetes mellitus: Secondary | ICD-10-CM | POA: Diagnosis not present

## 2015-01-10 MED ORDER — ZOLPIDEM TARTRATE 10 MG PO TABS: ORAL_TABLET | ORAL | Status: DC

## 2015-01-10 NOTE — Assessment & Plan Note (Signed)
Patient cuts Ambien in half or in thirds to help get to sleep over her husband's snoring like a bear. Will refill today.

## 2015-01-10 NOTE — Progress Notes (Signed)
BP 111/77 mmHg  Pulse 70  Temp(Src) 99.3 F (37.4 C) (Oral)  Ht 5' 4.75" (1.645 m)  Wt 158 lb 12.8 oz (72.031 kg)  BMI 26.62 kg/m2   Subjective:    Patient ID: Carla Woodard, female    DOB: Jan 30, 1971, 44 y.o.   MRN: [id number]  HPI: Carla Woodard is a 44 y.o. female presenting on 01/10/2015 for Medication Refill   HPI Hypertension Patient presents today for recheck of her hypertension. She is on hydrochlorothiazide 12.5 mg daily currently. Her blood pressure today is 111/77. Patient denies headaches, blurred vision, chest pains, shortness of breath, or weakness. Denies any side effects from medication and is content with current medication. She feels like her blood pressures been good a lot recently and would like to try going off the medication for a short time period.  Insomnia Patient has a husband who snores like a "bear". She occasionally has to use Ambien to help her get to bed. She usually cuts in half or in thirds to just help her get that initial fall asleep.   Relevant past medical, surgical, family and social history reviewed and updated as indicated. Interim medical history since our last visit reviewed. Allergies and medications reviewed and updated.  Review of Systems  Constitutional: Negative for fever and chills.  HENT: Negative for congestion, ear discharge and ear pain.   Eyes: Negative for pain, redness and visual disturbance.  Respiratory: Negative for chest tightness and shortness of breath.   Cardiovascular: Negative for chest pain, palpitations and leg swelling.  Endocrine: Negative for cold intolerance and heat intolerance.  Genitourinary: Negative for dysuria and difficulty urinating.  Musculoskeletal: Negative for back pain and gait problem.  Skin: Negative for rash.  Neurological: Negative for dizziness, speech difficulty, light-headedness and headaches.  Psychiatric/Behavioral: Negative for behavioral problems and agitation.  All other systems  reviewed and are negative.   Per HPI unless specifically indicated above     Medication List       This list is accurate as of: 01/10/15  8:23 AM.  Always use your most recent med list.               norethindrone 0.35 MG tablet  Commonly known as:  MICRONOR,CAMILA,ERRIN  Take 1 tablet (0.35 mg total) by mouth daily.     zolpidem 10 MG tablet  Commonly known as:  AMBIEN  TAKE 1/2 TABLET AT BEDTIME AS NEEDED FOR SLEEP           Objective:    BP 111/77 mmHg  Pulse 70  Temp(Src) 99.3 F (37.4 C) (Oral)  Ht 5' 4.75" (1.645 m)  Wt 158 lb 12.8 oz (72.031 kg)  BMI 26.62 kg/m2  Wt Readings from Last 3 Encounters:  01/10/15 158 lb 12.8 oz (72.031 kg)  11/12/14 159 lb (72.122 kg)  08/19/14 163 lb (73.936 kg)    Physical Exam  Constitutional: She is oriented to person, place, and time. She appears well-developed and well-nourished. No distress.  Eyes: Conjunctivae and EOM are normal. Pupils are equal, round, and reactive to light.  Neck: Neck supple. No thyromegaly present.  Cardiovascular: Normal rate, regular rhythm, normal heart sounds and intact distal pulses.   No murmur heard. Pulmonary/Chest: Effort normal and breath sounds normal. No respiratory distress. She has no wheezes.  Musculoskeletal: Normal range of motion. She exhibits no edema or tenderness.  Lymphadenopathy:    She has no cervical adenopathy.  Neurological: She is alert and oriented to person,  place, and time. Coordination normal.  Skin: Skin is warm and dry. No rash noted. She is not diaphoretic.  Psychiatric: She has a normal mood and affect. Her behavior is normal.  Vitals reviewed.   Results for orders placed or performed in visit on 11/12/14  POCT rapid strep A  Result Value Ref Range   Rapid Strep A Screen Negative Negative      Assessment & Plan:   Problem List Items Addressed This Visit      Cardiovascular and Mediastinum   Essential hypertension    Patient has controlled  hypertension on a very low-dose of hydrochlorothiazide and would like to go off of it for a trial. We will try for a few months and she will do home blood pressures and call me if he gets bit higher than 135/85.      Relevant Orders   CMP14+EGFR     Other   Insomnia - Primary    Patient cuts Ambien in half or in thirds to help get to sleep over her husband's snoring like a bear. Will refill today.       Other Visit Diagnoses    Screening for lipoid disorders        Relevant Orders    Lipid panel    Diabetes mellitus screening        Relevant Orders    CMP14+EGFR        Follow up plan: Return in about 6 months (around 07/10/2015), or if symptoms worsen or fail to improve.  Caryl Pina, MD [facility] 01/10/2015, 8:23 AM

## 2015-01-10 NOTE — Assessment & Plan Note (Signed)
Patient has controlled hypertension on a very low-dose of hydrochlorothiazide and would like to go off of it for a trial. We will try for a few months and she will do home blood pressures and call me if he gets bit higher than 135/85.

## 2015-01-10 NOTE — Patient Instructions (Signed)

## 2015-01-11 LAB — CMP14+EGFR
ALBUMIN: 4.1 g/dL (ref 3.5–5.5)
ALT: 11 IU/L (ref 0–32)
AST: 17 IU/L (ref 0–40)
Albumin/Globulin Ratio: 2 (ref 1.1–2.5)
Alkaline Phosphatase: 54 IU/L (ref 39–117)
BUN/Creatinine Ratio: 11 (ref 9–23)
BUN: 9 mg/dL (ref 6–24)
Bilirubin Total: 0.3 mg/dL (ref 0.0–1.2)
CALCIUM: 9.2 mg/dL (ref 8.7–10.2)
CO2: 23 mmol/L (ref 18–29)
CREATININE: 0.82 mg/dL (ref 0.57–1.00)
Chloride: 97 mmol/L (ref 97–108)
GFR, EST AFRICAN AMERICAN: 99 mL/min/{1.73_m2} (ref >59)
GFR, EST NON AFRICAN AMERICAN: 86 mL/min/{1.73_m2} (ref >59)
GLOBULIN, TOTAL: 2.1 g/dL (ref 1.5–4.5)
Glucose: 88 mg/dL (ref 65–99)
Potassium: 4.3 mmol/L (ref 3.5–5.2)
SODIUM: 136 mmol/L (ref 134–144)
TOTAL PROTEIN: 6.2 g/dL (ref 6.0–8.5)

## 2015-01-11 LAB — LIPID PANEL
CHOL/HDL RATIO: 2.2 ratio (ref 0.0–4.4)
Cholesterol, Total: 151 mg/dL (ref 100–199)
HDL: 70 mg/dL (ref >39)
LDL CALC: 70 mg/dL (ref 0–99)
TRIGLYCERIDES: 53 mg/dL (ref 0–149)
VLDL Cholesterol Cal: 11 mg/dL (ref 5–40)

## 2015-01-13 ENCOUNTER — Other Ambulatory Visit: Payer: Self-pay | Admitting: Family

## 2015-02-21 ENCOUNTER — Encounter: Payer: Self-pay | Admitting: Certified Nurse Midwife

## 2015-02-21 ENCOUNTER — Ambulatory Visit (INDEPENDENT_AMBULATORY_CARE_PROVIDER_SITE_OTHER): Payer: BC Managed Care – PPO | Admitting: Certified Nurse Midwife

## 2015-02-21 VITALS — BP 120/80 | HR 72 | Resp 16 | Ht 64.75 in | Wt 158.0 lb

## 2015-02-21 DIAGNOSIS — Z3041 Encounter for surveillance of contraceptive pills: Secondary | ICD-10-CM | POA: Diagnosis not present

## 2015-02-21 DIAGNOSIS — Z0000 Encounter for general adult medical examination without abnormal findings: Secondary | ICD-10-CM | POA: Diagnosis not present

## 2015-02-21 DIAGNOSIS — Z01419 Encounter for gynecological examination (general) (routine) without abnormal findings: Secondary | ICD-10-CM

## 2015-02-21 DIAGNOSIS — N852 Hypertrophy of uterus: Secondary | ICD-10-CM | POA: Diagnosis not present

## 2015-02-21 LAB — HEMOGLOBIN, FINGERSTICK: Hemoglobin, fingerstick: 14 g/dL (ref 12.0–16.0)

## 2015-02-21 MED ORDER — NORETHINDRONE 0.35 MG PO TABS: 1.0000 | ORAL_TABLET | Freq: Every day | ORAL | Status: DC

## 2015-02-21 NOTE — Patient Instructions (Signed)

## 2015-02-21 NOTE — Progress Notes (Signed)
44 y.o. G56P1001 Married  Caucasian Fe here for annual exam. POP use doing OK, with spotting at times and light or normal period. Now off HCTZ per PCP for one month with no issues. PCP watching BP and does aex/labs. Patient feels better and continues to exercise and eat healthy. 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 No other health issues today.  Patient's last menstrual period was 02/19/2015. spotting        Sexually active: Yes.    The current method of family planning is oral progesterone-only contraceptive.    Exercising: Yes.    cardio & weights Smoker:  no  Health Maintenance: Pap:  08-20-14 neg HPV HR neg MMG:  10-01-13 category 2 density:neg due patient is aware Colonoscopy:  none BMD:   none TDaP:  2007 Labs: hgb-14.0 Self breast exam: not done   reports that she has never smoked. She has never used smokeless tobacco. She reports that she drinks about 0.6 - 1.2 oz of alcohol per week. She reports that she does not use illicit drugs.  Past Medical History  Diagnosis Date  . Abnormal Pap smear   . Hypertension     not currently on meds    Past Surgical History  Procedure Laterality Date  . Cervix lesion destruction      ?  Marland Kitchen Eye surgery    . Vulva surgery  07/20/10    bilateral revision labia minora    Current Outpatient Prescriptions  Medication Sig Dispense Refill  . norethindrone (MICRONOR,CAMILA,ERRIN) 0.35 MG tablet Take 1 tablet (0.35 mg total) by mouth daily. 3 Package 4  . zolpidem (AMBIEN) 10 MG tablet TAKE 1/2 TABLET AT BEDTIME AS NEEDED FOR SLEEP 30 tablet 2   No current facility-administered medications for this visit.    Family History  Problem Relation Age of Onset  . Heart disease Mother   . Cirrhosis Mother   . Heart disease Maternal Grandmother     pacemaker  . Stroke Maternal Grandmother   . Diabetes Maternal Aunt   . Lung cancer Paternal Grandfather   .  Aneurysm Father     ROS:  Pertinent items are noted in HPI.  Otherwise, a comprehensive ROS was negative.  Exam:   BP 120/80 mmHg  Pulse 72  Resp 16  Ht 5' 4.75" (1.645 m)  Wt 158 lb (71.668 kg)  BMI 26.48 kg/m2  LMP 02/19/2015 Height: 5' 4.75" (164.5 cm) Ht Readings from Last 3 Encounters:  02/21/15 5' 4.75" (1.645 m)  01/10/15 5' 4.75" (1.645 m)  11/12/14 5' 4.75" (1.645 m)    General appearance: alert, cooperative and appears stated age Head: Normocephalic, without obvious abnormality, atraumatic Neck: no adenopathy, supple, symmetrical, trachea midline and thyroid normal to inspection and palpation Lungs: clear to auscultation bilaterally Breasts: normal appearance, no masses or tenderness, No nipple retraction or dimpling, No nipple discharge or bleeding, No axillary or supraclavicular adenopathy Heart: regular rate and rhythm Abdomen: soft, non-tender; no masses,  no organomegaly Extremities: extremities normal, atraumatic, no cyanosis or edema Skin: Skin color, texture, turgor normal. No rashes or lesions Lymph nodes: Cervical, supraclavicular, and axillary nodes normal. No abnormal inguinal nodes palpated Neurologic: Grossly normal   Pelvic: External genitalia:  no lesions              Urethra:  normal appearing urethra with no masses, tenderness or lesions  Bartholin's and Skene's: normal                 Vagina: normal appearing vagina with normal color and discharge, no lesions              Cervix: normal non tender, no lesions              Pap taken: No. Bimanual xam:  Uterus:  enlarged, 8-10 weeks size, slightly firm , non tender,               Adnexa: normal adnexa and no mass, fullness, tenderness               Rectovaginal: Confirms               Anus:  normal sphincter tone, no lesions  Chaperone present: yes  A:  Well Woman with normal exam  Contraception POP due hypertension, cardiac evaluation, sinus arrhythmia  now off  medication  Previous Cryo for abnormal cells  In her 20's  Enlarged uterus  Mammogram due  P:   Reviewed health and wellness pertinent to exam  Discussed bleeding profile expectations with POP, also discussed other contraception options of IUD,Nexplanon. Patient will continue POP at present, she and spouse have been discussing vasectomy for him. Given menses calendar to record menses and spotting for the next month or two will drop it by or fax for review, to assess if out of normal range. Conversation today appears normal profile with POP.  Discussed finding of enlarged uterus and possible etiology of fibroid(benign) findings, adenomyosis, or just a change for her. Discussed PUS evaluation, patient agreeable. Patient will be called with insurance information and scheduled. Questions addressed. Also addressed will view ovaries also.  Pap smear as above not taken   counseled on breast self exam, Stressed importance of mammography screening, patient will schedule, family planning choices, adequate intake of calcium and vitamin D, diet and exercise. Given handout on perimenopause and menopause for her review.  return annually or prn  An After Visit Summary was printed and given to the patient.

## 2015-02-26 NOTE — Progress Notes (Signed)
Encounter reviewed Carla Dung, MD   

## 2015-03-03 ENCOUNTER — Telehealth: Payer: Self-pay | Admitting: Certified Nurse Midwife

## 2015-03-03 NOTE — Telephone Encounter (Signed)
Reviewed benefit for PUS with patient. She is agreeable and ready to schedule. Offered patient multiple upcoming dates, patient is out of town next week. Patient agreeable to 03/18/15 with Talbert Nan. Patient aware of 72 hour cancellation policy with $885 fee. Reviewed arrival date/time. Patient agreeable. Ok to close.

## 2015-03-10 ENCOUNTER — Telehealth: Payer: Self-pay | Admitting: Family Medicine

## 2015-03-17 ENCOUNTER — Telehealth: Payer: Self-pay | Admitting: Obstetrics and Gynecology

## 2015-03-17 DIAGNOSIS — N852 Hypertrophy of uterus: Secondary | ICD-10-CM

## 2015-03-17 NOTE — Telephone Encounter (Signed)
Left message to call Mattew Chriswell at [phone]. 

## 2015-03-17 NOTE — Telephone Encounter (Signed)
Left message to call Kaitlyn at [phone]. 

## 2015-03-17 NOTE — Telephone Encounter (Signed)
I think she needs to have the ultrasound first, we can further discuss if the IUD is an option for her (not recommended if the cavity length is over 10 cm)

## 2015-03-17 NOTE — Telephone Encounter (Signed)
I called this patient to remind her of her PUS appointment tomorrow. Patient confirmed. Patient thinks may want an IUD. Patient is asking about possibility of  having IUD inserted tomorrow at her PUS appointment.

## 2015-03-17 NOTE — Telephone Encounter (Signed)
Spoke with patient. Patient is scheduled for a PUS appointment tomorrow with Dr.Jertson. Patient states that she spoke with Melvia Heaps CNM at her aex on 02/21/2015 about an IUD. Patient would like to proceed with this at this time. Asking if Dr.Jertson could insert this at her PUS appointment tomorrow as she has the afternoon off. Advised I will need to speak with Dr.Jertson regarding this and return call. Patient is agreeable.

## 2015-03-18 ENCOUNTER — Ambulatory Visit (INDEPENDENT_AMBULATORY_CARE_PROVIDER_SITE_OTHER): Payer: BC Managed Care – PPO | Admitting: Obstetrics and Gynecology

## 2015-03-18 ENCOUNTER — Other Ambulatory Visit: Payer: Self-pay | Admitting: Obstetrics and Gynecology

## 2015-03-18 ENCOUNTER — Ambulatory Visit (INDEPENDENT_AMBULATORY_CARE_PROVIDER_SITE_OTHER): Payer: BC Managed Care – PPO

## 2015-03-18 VITALS — BP 122/70 | HR 60 | Resp 14 | Wt 161.0 lb

## 2015-03-18 DIAGNOSIS — E010 Iodine-deficiency related diffuse (endemic) goiter: Secondary | ICD-10-CM

## 2015-03-18 DIAGNOSIS — E049 Nontoxic goiter, unspecified: Secondary | ICD-10-CM | POA: Diagnosis not present

## 2015-03-18 DIAGNOSIS — N852 Hypertrophy of uterus: Secondary | ICD-10-CM | POA: Diagnosis not present

## 2015-03-18 DIAGNOSIS — N939 Abnormal uterine and vaginal bleeding, unspecified: Secondary | ICD-10-CM

## 2015-03-18 DIAGNOSIS — D250 Submucous leiomyoma of uterus: Secondary | ICD-10-CM | POA: Diagnosis not present

## 2015-03-18 DIAGNOSIS — D259 Leiomyoma of uterus, unspecified: Secondary | ICD-10-CM | POA: Diagnosis not present

## 2015-03-18 DIAGNOSIS — N83202 Unspecified ovarian cyst, left side: Secondary | ICD-10-CM | POA: Diagnosis not present

## 2015-03-18 NOTE — Telephone Encounter (Signed)
Spoke with patient. Advised of message as seen below from Dr.Jertson. Patient is agreeable and verbalizes understanding.  Routing to provider for final review. Patient agreeable to disposition. Will close encounter.  

## 2015-03-18 NOTE — Addendum Note (Signed)
Addended by: Michele Mcalpine on: 03/18/2015 11:46 AM   Modules accepted: Orders

## 2015-03-18 NOTE — Progress Notes (Addendum)
Patient ID: Carla Woodard, female   DOB: Jul 02, 1970, 44 y.o.   MRN: [id number]  S: the patient presents for evaluation of AUB. She went off OCP's last year because of elevated BP and was switched to micronor. She has subsequently gone off of anti-HTN medications. On the micronor her bleeding has been irregular. She can bleed for one week in the month, or can bleed off and on every few weeks. At the heaviest she goes through a pad in a couple of hours.  Sexually active, no pain with intercourse.  Past Medical History  Diagnosis Date  . Abnormal Pap smear   . Hypertension     not currently on meds   Past Surgical History  Procedure Laterality Date  . Cervix lesion destruction      ?  Marland Kitchen Eye surgery    . Vulva surgery  07/20/10    bilateral revision labia minora   Last abnormal was over 20 years ago  Current Outpatient Prescriptions on File Prior to Visit  Medication Sig Dispense Refill  . norethindrone (MICRONOR,CAMILA,ERRIN) 0.35 MG tablet Take 1 tablet (0.35 mg total) by mouth daily. 3 Package 4  . zolpidem (AMBIEN) 10 MG tablet TAKE 1/2 TABLET AT BEDTIME AS NEEDED FOR SLEEP 30 tablet 2   No current facility-administered medications on file prior to visit.   Allergies  Allergen Reactions  . Latex Rash   SH: married, 1 child (almost 30), works for the school system. No tobacco. Rare ETOH.  FH: MGM died of a CVA in her late 70's  Mom with heart disease  Father with an aortic aneurysm  2 PUncles with leukemia   Review of Systems  Genitourinary:       Unscheduled bleeding   All other systems reviewed and are negative. Also c/o constipation   O: Alert caucasian female in NAD Blood pressure 122/70, pulse 60, resp. rate 14, weight 161 lb (73.029 kg), last menstrual period 02/27/2015. Neck: supple, thyroid asymmetric, R>L CV: RRR, no murmurs Lungs: CTAB Abd: soft, not tender, no masses Pelvic: normal external genitalia, normal vaginal mucosa. Cervix without  lesions  Sonohysterogram The procedure and risks of the procedure were reviewed with the patient, consent form was signed. A speculum was placed in the vagina and the cervix was cleansed with betadine. The sonohysterogram catheter was inserted into the uterine cavity without difficulty. Saline was infused under direct observation with the ultrasound.  She had asymmetric thickening of her endometrium and had a 2 cm intracavitary myoma.The catheter was removed.      A/P 1)Abnormal uterine bleeding on micronor, intracavitary mass on sonohysterogram cw myoma as well a asymmetric thickening of the endometrium  -Plan hysteroscopic myomectomy, dilation and curettage  -Reviewed risks, including; bleeding, infection, uterine perforation  -Discussed the possible use of the mirena IUD after her myomectomy, uterine size is appropriate   Spent 25 minutes face to face with the patient, over 50% in counselling  Addendum 1)She also had a 2 cm complex left ovarian cyst, likely hemorrhagic CL Will plan F/U ultrasound in 2 months 2)Thyroid asymmetric, concerning for a possible nodule, will check TFT's and set up a thyroid ultrasound  Sumner Boast, MD

## 2015-03-18 NOTE — Telephone Encounter (Signed)
Patient is returning a call to Kaitlyn. °

## 2015-03-19 ENCOUNTER — Encounter: Payer: Self-pay | Admitting: Obstetrics and Gynecology

## 2015-03-19 ENCOUNTER — Telehealth: Payer: Self-pay

## 2015-03-19 LAB — THYROID PANEL WITH TSH
FREE THYROXINE INDEX: 1.8 (ref 1.4–3.8)
T3 Uptake: 28 % (ref 22–35)
T4, Total: 6.6 ug/dL (ref 4.5–12.0)
TSH: 1.264 u[IU]/mL (ref 0.350–4.500)

## 2015-03-19 NOTE — Telephone Encounter (Signed)
Spoke with patient. Advised I have scheduled her Thyroid Ultrasound for 03/21/2015 at 2:45 pm at [address]. Patient is agreeable and verbalizes understanding. Placed in imaging hold.  Routing to provider for final review. Patient agreeable to disposition. Will close encounter.

## 2015-03-19 NOTE — Telephone Encounter (Signed)
Spoke with patient regarding scheduling of her Thyroid Ultrasound. Patient is requesting an appointment for 11/11 or 11/18. Advised I will call [facility] to schedule this appointment and return call with appointment date and time. Patient is agreeable.

## 2015-03-20 ENCOUNTER — Telehealth: Payer: Self-pay | Admitting: Obstetrics and Gynecology

## 2015-03-20 NOTE — Telephone Encounter (Signed)
Spoke with patient regarding surgery benefit. Patient understands and agreeable. Patient aware this is professional benefit only and will be contacted by hospital for further benefits. Patient paid deposit. Patient ready to schedule and looking into December for surgery date. Patient agreeable to return call from [facility] for scheduling. Routing to [place] for scheduling. Surgery folder to [facility].

## 2015-03-20 NOTE — Telephone Encounter (Signed)
Call to patient regarding scheduling. Left message to call back.

## 2015-03-21 ENCOUNTER — Ambulatory Visit
Admission: RE | Admit: 2015-03-21 | Discharge: 2015-03-21 | Disposition: A | Discharge status: Home / Self Care | Payer: BC Managed Care – PPO | Source: Ambulatory Visit | Attending: Obstetrics and Gynecology | Admitting: Obstetrics and Gynecology

## 2015-03-21 DIAGNOSIS — E010 Iodine-deficiency related diffuse (endemic) goiter: Secondary | ICD-10-CM

## 2015-03-24 ENCOUNTER — Telehealth: Payer: Self-pay | Admitting: Obstetrics and Gynecology

## 2015-03-24 ENCOUNTER — Telehealth: Payer: Self-pay

## 2015-03-24 DIAGNOSIS — E041 Nontoxic single thyroid nodule: Secondary | ICD-10-CM

## 2015-03-24 NOTE — Telephone Encounter (Signed)
Insurance prior authorized Zolpidem

## 2015-03-24 NOTE — Telephone Encounter (Signed)
Called patient to review her thyroid ultrasound, left her a message to call.  She is going to need a thyroid biopsy, will set up an appointment with endocrinology.

## 2015-03-24 NOTE — Telephone Encounter (Signed)
Call to patient to discuss surgery date options. Patient prefers December 5 or 12. Would rather not have a November date if possible. Advised would work on scheduling and call her back.

## 2015-03-24 NOTE — Telephone Encounter (Signed)
Patient is returning a call to Sally. °

## 2015-03-25 NOTE — Telephone Encounter (Signed)
Spoke with patient. Advised I have scheduled her for the first available appointment following the holiday on 12/19 at 3 pm with Dr.Ellison at [facility]. Advise there is an early morning appointment on 12/14 at 9:30 am if she prefers. Patient would like to move appointment to 12/14 at 9:30 am. Call to [facility] appointment moved to 12/14 at 9:30 am with Dr.Ellison. Return call to patient to advise appointment has been moved. Patient is agreeable to appointment date and time. Address and phone number provided.  Routing to provider for final review. Patient agreeable to disposition. Will close encounter.

## 2015-03-25 NOTE — Telephone Encounter (Signed)
Returned patient's call. Reviewed her thyroid ultrasound, will set her for an appointment with endocrinology for a thyroid biopsy.

## 2015-03-25 NOTE — Telephone Encounter (Signed)
Patient returning your call [phone].

## 2015-03-28 ENCOUNTER — Telehealth: Payer: Self-pay | Admitting: Obstetrics and Gynecology

## 2015-03-28 NOTE — Telephone Encounter (Signed)
Patient is asking for her surgery schedule status. Patient said Gay Filler was checking on 04/14/15 of 04/21/15 for possible surgery dates.

## 2015-03-28 NOTE — Telephone Encounter (Signed)
Return call to patient. Surgery scheduled for 04-21-15 at 1030 at [place] at [facility] unless otherwise instructed by hospital.  Surgery instruction sheet reviewed and printed copy mailed to patient. See copy scanned to chart.  Routing to provider for final review. Patient agreeable to disposition. Will close encounter.

## 2015-03-28 NOTE — Telephone Encounter (Signed)
See phone encounter on 03-28-15 for surgery information.  Routing to provider for final review. Patient agreeable to disposition. Will close encounter.

## 2015-04-07 ENCOUNTER — Other Ambulatory Visit: Payer: Self-pay | Admitting: Certified Nurse Midwife

## 2015-04-07 ENCOUNTER — Telehealth: Payer: Self-pay | Admitting: Obstetrics and Gynecology

## 2015-04-07 NOTE — Telephone Encounter (Signed)
Patient received a letter that she had her pre-op done the nov.8 and that she does not need to come in on Dec. 1st. Just wanted to check to make sure.

## 2015-04-07 NOTE — Telephone Encounter (Signed)
Dr.Jertson, patient was seen on 03/18/2015 for [facility]. Patient is calling stating she received a letter saying she does not need to keep her Dec 1st appointment for pre-op consult since she was seen on 11/8. Patient is scheduled for surgery on 04/21/2015. Please review and advise.

## 2015-04-07 NOTE — Telephone Encounter (Signed)
Spoke with patient. Advised of message as seen below from [place]. Patient is agreeable and verbalizes understanding. Patient will keep appointment for 12/1 at 2:30 pm with Dr.Jertson.  Routing to provider for final review. Patient agreeable to disposition. Will close encounter.

## 2015-04-07 NOTE — Telephone Encounter (Signed)
Since her surgery is more than 30 days since her last appointment she should come in for another pre-op

## 2015-04-09 ENCOUNTER — Telehealth: Payer: Self-pay | Admitting: *Deleted

## 2015-04-09 NOTE — Telephone Encounter (Signed)
Call to patient, left message to call back. Calling to schedule 2 month follow-up ultrasound from ovarian cyst.

## 2015-04-10 ENCOUNTER — Ambulatory Visit (INDEPENDENT_AMBULATORY_CARE_PROVIDER_SITE_OTHER): Payer: BC Managed Care – PPO | Admitting: Obstetrics and Gynecology

## 2015-04-10 ENCOUNTER — Encounter: Payer: Self-pay | Admitting: Obstetrics and Gynecology

## 2015-04-10 VITALS — BP 110/70 | HR 80 | Resp 15 | Wt 163.0 lb

## 2015-04-10 DIAGNOSIS — Z01818 Encounter for other preprocedural examination: Secondary | ICD-10-CM

## 2015-04-10 DIAGNOSIS — N83202 Unspecified ovarian cyst, left side: Secondary | ICD-10-CM

## 2015-04-10 DIAGNOSIS — N939 Abnormal uterine and vaginal bleeding, unspecified: Secondary | ICD-10-CM

## 2015-04-10 DIAGNOSIS — D259 Leiomyoma of uterus, unspecified: Secondary | ICD-10-CM

## 2015-04-10 NOTE — H&P (Signed)
  Patient ID: Carla Woodard, female   DOB: 11-Jan-1971, 44 y.o.   MRN: [id number] 44 y.o. G1P1001 MarriedCaucasian female here for discussion of upcoming procedure.  D&C hysteroscopy with Myosure planned due to 04-21-15.  Pre-op evaluation thus far has included a sonohysterogram that revealed a 2 cm intracavitary myoma and asymmetric thickening of the endometrium.  She is being evaluated for a thyroid nodule, will need a thyroid biopsy, normal blood work.     Ob Hx:   Patient's last menstrual period was 04/02/2015.          Sexually active: Yes.   Birth control: oral progesterone-only contraceptive Last pap: 08-20-14 WNL NEG HR HPV Last MMG: 10-02-13 WNL Tobacco: Never Review of Systems  Genitourinary:       Irregular menstrual cycles   All other systems reviewed and are negative.   Past Surgical History  Procedure Laterality Date  . Cervix lesion destruction      ?  Marland Kitchen Eye surgery    . Vulva surgery  07/20/10    bilateral revision labia minora    Past Medical History  Diagnosis Date  . Abnormal Pap smear   . Hypertension     not currently on meds    Allergies: Latex  Current Outpatient Prescriptions  Medication Sig Dispense Refill  . ibuprofen (ADVIL,MOTRIN) 200 MG tablet Take 400 mg by mouth every 6 (six) hours as needed for moderate pain.    Marland Kitchen MELATONIN PO Take 1 tablet by mouth at bedtime.    . Multiple Vitamins-Minerals (MULTIVITAMIN PO) Take 1 tablet by mouth daily.    . norethindrone (MICRONOR,CAMILA,ERRIN) 0.35 MG tablet Take 1 tablet (0.35 mg total) by mouth daily. 3 Package 4  . zolpidem (AMBIEN) 10 MG tablet TAKE 1/2 TABLET AT BEDTIME AS NEEDED FOR SLEEP (Patient taking differently: Take 5 mg by mouth at bedtime as needed for sleep. TAKE 1/2 TABLET AT BEDTIME AS NEEDED FOR SLEEP) 30 tablet 2   No current facility-administered medications for this visit.    ROS: Pertinent items noted in HPI and remainder of comprehensive ROS otherwise negative.  Exam:    BP  110/70 mmHg  Pulse 80  Resp 15  Wt 163 lb (73.936 kg)  LMP 04/02/2015  General appearance: alert and cooperative Head: Normocephalic, without obvious abnormality, atraumatic Neck: no adenopathy, supple, thyroid R>L Lungs: clear to auscultation bilaterally Heart: regular rate and rhythm, S1, S2 normal, no murmur, click, rub or gallop Abdomen: soft, non-tender; bowel sounds normal; no masses,  no organomegaly Extremities: extremities normal, atraumatic, no cyanosis or edema Skin: Skin color, texture, turgor normal. No rashes or lesions Neurologic: Grossly normal  A/P 1)Abnormal uterine bleeding, 2 cm intracavitary myoma noted on sonohysterogram as well as asymmetrically thickened endometrium  -Plan hysteroscopy, myomectomy, D&C  -Reviewed risks, including: bleeding, infection, uterine perforation, fluid overload, need for further sugery  2) H/O HTN, no longer needing medication. BP normal  3) Thyroid nodule, normal TFT's, awaiting biopsy

## 2015-04-10 NOTE — Progress Notes (Signed)
Patient ID: Carla Woodard, female   DOB: November 07, 1970, 44 y.o.   MRN: [id number] 44 y.o. G1P1001 MarriedCaucasian female here for discussion of upcoming procedure.  D&C hysteroscopy with Myosure planned due to 04-21-15.  Pre-op evaluation thus far has included a sonohysterogram that revealed a 2 cm intracavitary myoma and asymmetric thickening of the endometrium.  She is being evaluated for a thyroid nodule, will need a thyroid biopsy, normal blood work.     Ob Hx:   Patient's last menstrual period was 04/02/2015.          Sexually active: Yes.   Birth control: oral progesterone-only contraceptive Last pap: 08-20-14 WNL NEG HR HPV Last MMG: 10-02-13 WNL Tobacco: Never Review of Systems  Genitourinary:       Irregular menstrual cycles   All other systems reviewed and are negative.   Past Surgical History  Procedure Laterality Date  . Cervix lesion destruction      ?  Marland Kitchen Eye surgery    . Vulva surgery  07/20/10    bilateral revision labia minora    Past Medical History  Diagnosis Date  . Abnormal Pap smear   . Hypertension     not currently on meds    Allergies: Latex  Current Outpatient Prescriptions  Medication Sig Dispense Refill  . ibuprofen (ADVIL,MOTRIN) 200 MG tablet Take 400 mg by mouth every 6 (six) hours as needed for moderate pain.    Marland Kitchen MELATONIN PO Take 1 tablet by mouth at bedtime.    . Multiple Vitamins-Minerals (MULTIVITAMIN PO) Take 1 tablet by mouth daily.    . norethindrone (MICRONOR,CAMILA,ERRIN) 0.35 MG tablet Take 1 tablet (0.35 mg total) by mouth daily. 3 Package 4  . zolpidem (AMBIEN) 10 MG tablet TAKE 1/2 TABLET AT BEDTIME AS NEEDED FOR SLEEP (Patient taking differently: Take 5 mg by mouth at bedtime as needed for sleep. TAKE 1/2 TABLET AT BEDTIME AS NEEDED FOR SLEEP) 30 tablet 2   No current facility-administered medications for this visit.    ROS: Pertinent items noted in HPI and remainder of comprehensive ROS otherwise negative.  Exam:    BP 110/70  mmHg  Pulse 80  Resp 15  Wt 163 lb (73.936 kg)  LMP 04/02/2015  General appearance: alert and cooperative Head: Normocephalic, without obvious abnormality, atraumatic Neck: no adenopathy, supple, thyroid R>L Lungs: clear to auscultation bilaterally Heart: regular rate and rhythm, S1, S2 normal, no murmur, click, rub or gallop Abdomen: soft, non-tender; bowel sounds normal; no masses,  no organomegaly Extremities: extremities normal, atraumatic, no cyanosis or edema Skin: Skin color, texture, turgor normal. No rashes or lesions Neurologic: Grossly normal  A/P 1)Abnormal uterine bleeding, 2 cm intracavitary myoma noted on sonohysterogram as well as asymmetrically thickened endometrium  -Plan hysteroscopy, myomectomy, D&C  -Reviewed risks, including: bleeding, infection, uterine perforation, fluid overload, need for further sugery  2) H/O HTN, no longer needing medication. BP normal  3) Thyroid nodule, normal TFT's, awaiting biopsy  4) left ovarian cyst  -f/U ultrasound in 1/17

## 2015-04-18 NOTE — Telephone Encounter (Signed)
Follow-up call to patient to schedule 2 month recheck ultrasound. Appointment scheduled for 05-27-15 at [place] to provider for final review. Patient agreeable to disposition. Will close encounter.    CC: Kerry Hough, referral coordinator.

## 2015-04-21 ENCOUNTER — Encounter (HOSPITAL_COMMUNITY): Payer: Self-pay

## 2015-04-21 ENCOUNTER — Ambulatory Visit (HOSPITAL_COMMUNITY): Payer: BC Managed Care – PPO | Admitting: Certified Registered Nurse Anesthetist

## 2015-04-21 ENCOUNTER — Ambulatory Visit (HOSPITAL_COMMUNITY)
Admission: RE | Admit: 2015-04-21 | Discharge: 2015-04-21 | Disposition: A | Discharge status: Home / Self Care | Payer: BC Managed Care – PPO | Source: Ambulatory Visit | Attending: Obstetrics and Gynecology | Admitting: Obstetrics and Gynecology

## 2015-04-21 ENCOUNTER — Encounter (HOSPITAL_COMMUNITY)
Admission: RE | Disposition: A | Discharge status: Home / Self Care | Payer: Self-pay | Source: Ambulatory Visit | Attending: Obstetrics and Gynecology

## 2015-04-21 DIAGNOSIS — F419 Anxiety disorder, unspecified: Secondary | ICD-10-CM | POA: Diagnosis not present

## 2015-04-21 DIAGNOSIS — D259 Leiomyoma of uterus, unspecified: Secondary | ICD-10-CM | POA: Diagnosis not present

## 2015-04-21 DIAGNOSIS — Z79899 Other long term (current) drug therapy: Secondary | ICD-10-CM | POA: Diagnosis not present

## 2015-04-21 DIAGNOSIS — N939 Abnormal uterine and vaginal bleeding, unspecified: Secondary | ICD-10-CM | POA: Diagnosis present

## 2015-04-21 DIAGNOSIS — N938 Other specified abnormal uterine and vaginal bleeding: Secondary | ICD-10-CM | POA: Diagnosis not present

## 2015-04-21 DIAGNOSIS — I10 Essential (primary) hypertension: Secondary | ICD-10-CM | POA: Insufficient documentation

## 2015-04-21 HISTORY — PX: DILATATION & CURETTAGE/HYSTEROSCOPY WITH MYOSURE: SHX6511

## 2015-04-21 LAB — CBC
HEMATOCRIT: 41.7 % (ref 36.0–46.0)
Hemoglobin: 14.1 g/dL (ref 12.0–15.0)
MCH: 31.7 pg (ref 26.0–34.0)
MCHC: 33.8 g/dL (ref 30.0–36.0)
MCV: 93.7 fL (ref 78.0–100.0)
PLATELETS: 368 10*3/uL (ref 150–400)
RBC: 4.45 MIL/uL (ref 3.87–5.11)
RDW: 12.9 % (ref 11.5–15.5)
WBC: 9.5 10*3/uL (ref 4.0–10.5)

## 2015-04-21 LAB — ABO/RH: ABO/RH(D): O POS

## 2015-04-21 LAB — TYPE AND SCREEN
ABO/RH(D): O POS
ANTIBODY SCREEN: NEGATIVE

## 2015-04-21 LAB — PREGNANCY, URINE: Preg Test, Ur: NEGATIVE

## 2015-04-21 SURGERY — DILATATION & CURETTAGE/HYSTEROSCOPY WITH MYOSURE
Anesthesia: General

## 2015-04-21 MED ORDER — KETOROLAC TROMETHAMINE 30 MG/ML IJ SOLN
INTRAMUSCULAR | Status: AC
  Filled 2015-04-21: qty 1

## 2015-04-21 MED ORDER — ONDANSETRON HCL 4 MG/2ML IJ SOLN
INTRAMUSCULAR | Status: AC
Start: 2015-04-21 — End: 2015-04-21
  Filled 2015-04-21: qty 2

## 2015-04-21 MED ORDER — PROPOFOL 10 MG/ML IV BOLUS
INTRAVENOUS | Status: AC
  Filled 2015-04-21: qty 20

## 2015-04-21 MED ORDER — MIDAZOLAM HCL 2 MG/2ML IJ SOLN
INTRAMUSCULAR | Status: DC | PRN
  Administered 2015-04-21: 2 mg via INTRAVENOUS

## 2015-04-21 MED ORDER — LIDOCAINE HCL (CARDIAC) 20 MG/ML IV SOLN
INTRAVENOUS | Status: AC
  Filled 2015-04-21: qty 5

## 2015-04-21 MED ORDER — ONDANSETRON HCL 4 MG/2ML IJ SOLN
INTRAMUSCULAR | Status: AC
  Filled 2015-04-21: qty 2

## 2015-04-21 MED ORDER — LACTATED RINGERS IV SOLN: INTRAVENOUS | Status: DC

## 2015-04-21 MED ORDER — FENTANYL CITRATE (PF) 100 MCG/2ML IJ SOLN
INTRAMUSCULAR | Status: AC
Start: 2015-04-21 — End: 2015-04-21
  Filled 2015-04-21: qty 2

## 2015-04-21 MED ORDER — ONDANSETRON HCL 4 MG/2ML IJ SOLN
INTRAMUSCULAR | Status: DC | PRN
  Administered 2015-04-21: 4 mg via INTRAVENOUS

## 2015-04-21 MED ORDER — GLYCOPYRROLATE 0.2 MG/ML IJ SOLN
INTRAMUSCULAR | Status: DC | PRN
  Administered 2015-04-21: 0.2 mg via INTRAVENOUS

## 2015-04-21 MED ORDER — FENTANYL CITRATE (PF) 100 MCG/2ML IJ SOLN
25.0000 ug | INTRAMUSCULAR | Status: DC | PRN
  Administered 2015-04-21 (×2): 25 ug via INTRAVENOUS

## 2015-04-21 MED ORDER — DEXAMETHASONE SODIUM PHOSPHATE 10 MG/ML IJ SOLN
INTRAMUSCULAR | Status: AC
  Filled 2015-04-21: qty 1

## 2015-04-21 MED ORDER — LACTATED RINGERS IV SOLN
INTRAVENOUS | Status: DC | PRN
  Administered 2015-04-21 (×2): via INTRAVENOUS

## 2015-04-21 MED ORDER — FENTANYL CITRATE (PF) 250 MCG/5ML IJ SOLN
INTRAMUSCULAR | Status: AC
  Filled 2015-04-21: qty 5

## 2015-04-21 MED ORDER — DEXAMETHASONE SODIUM PHOSPHATE 10 MG/ML IJ SOLN
INTRAMUSCULAR | Status: DC | PRN
  Administered 2015-04-21: 4 mg via INTRAVENOUS

## 2015-04-21 MED ORDER — LACTATED RINGERS IV SOLN
INTRAVENOUS | Status: DC
  Administered 2015-04-21: 10:00:00 via INTRAVENOUS

## 2015-04-21 MED ORDER — HYDROMORPHONE HCL 1 MG/ML IJ SOLN
INTRAMUSCULAR | Status: AC
Start: 2015-04-21 — End: 2015-04-21
  Filled 2015-04-21: qty 1

## 2015-04-21 MED ORDER — VASOPRESSIN 20 UNIT/ML IV SOLN
INTRAVENOUS | Status: AC
  Filled 2015-04-21: qty 1

## 2015-04-21 MED ORDER — FENTANYL CITRATE (PF) 100 MCG/2ML IJ SOLN
INTRAMUSCULAR | Status: DC
Start: 2015-04-21 — End: 2015-04-21
  Filled 2015-04-21: qty 2

## 2015-04-21 MED ORDER — PROPOFOL 10 MG/ML IV BOLUS
INTRAVENOUS | Status: AC
Start: 2015-04-21 — End: 2015-04-21
  Filled 2015-04-21: qty 20

## 2015-04-21 MED ORDER — SODIUM CHLORIDE 0.9 % IJ SOLN
INTRAMUSCULAR | Status: AC
  Filled 2015-04-21: qty 100

## 2015-04-21 MED ORDER — ATROPINE SULFATE 0.1 MG/ML IJ SOLN
INTRAMUSCULAR | Status: AC
  Filled 2015-04-21: qty 10

## 2015-04-21 MED ORDER — GLYCOPYRROLATE 0.2 MG/ML IJ SOLN
INTRAMUSCULAR | Status: AC
  Filled 2015-04-21: qty 3

## 2015-04-21 MED ORDER — PROPOFOL 10 MG/ML IV BOLUS
INTRAVENOUS | Status: DC | PRN
  Administered 2015-04-21: 200 mg via INTRAVENOUS

## 2015-04-21 MED ORDER — LIDOCAINE HCL (CARDIAC) 20 MG/ML IV SOLN
INTRAVENOUS | Status: DC | PRN
  Administered 2015-04-21: 100 mg via INTRAVENOUS

## 2015-04-21 MED ORDER — SCOPOLAMINE 1 MG/3DAYS TD PT72
1.0000 | MEDICATED_PATCH | Freq: Once | TRANSDERMAL | Status: DC
  Administered 2015-04-21: 1.5 mg via TRANSDERMAL

## 2015-04-21 MED ORDER — MIDAZOLAM HCL 2 MG/2ML IJ SOLN
INTRAMUSCULAR | Status: AC
  Filled 2015-04-21: qty 2

## 2015-04-21 MED ORDER — PROMETHAZINE HCL 25 MG/ML IJ SOLN: 6.2500 mg | INTRAMUSCULAR | Status: DC | PRN

## 2015-04-21 MED ORDER — FENTANYL CITRATE (PF) 100 MCG/2ML IJ SOLN
INTRAMUSCULAR | Status: DC | PRN
  Administered 2015-04-21 (×2): 50 ug via INTRAVENOUS
  Administered 2015-04-21: 100 ug via INTRAVENOUS
  Administered 2015-04-21: 50 ug via INTRAVENOUS

## 2015-04-21 MED ORDER — SODIUM CHLORIDE 0.9 % IV SOLN
INTRAVENOUS | Status: DC | PRN
  Administered 2015-04-21: 20 mL via INTRAMUSCULAR

## 2015-04-21 MED ORDER — NEOSTIGMINE METHYLSULFATE 10 MG/10ML IV SOLN
INTRAVENOUS | Status: AC
  Filled 2015-04-21: qty 1

## 2015-04-21 MED ORDER — KETOROLAC TROMETHAMINE 30 MG/ML IJ SOLN
INTRAMUSCULAR | Status: DC | PRN
  Administered 2015-04-21: 30 mg via INTRAVENOUS

## 2015-04-21 MED ORDER — ROCURONIUM BROMIDE 100 MG/10ML IV SOLN
INTRAVENOUS | Status: AC
Start: 2015-04-21 — End: 2015-04-21
  Filled 2015-04-21: qty 1

## 2015-04-21 MED ORDER — SODIUM CHLORIDE 0.9 % IR SOLN
Status: DC | PRN
  Administered 2015-04-21: 1500 mL

## 2015-04-21 MED ORDER — SCOPOLAMINE 1 MG/3DAYS TD PT72
MEDICATED_PATCH | TRANSDERMAL | Status: AC
  Administered 2015-04-21: 1.5 mg via TRANSDERMAL
  Filled 2015-04-21: qty 1

## 2015-04-21 SURGICAL SUPPLY — 21 items
CANISTER SUCT 3000ML (MISCELLANEOUS) ×3 IMPLANT
CATH ROBINSON RED A/P 16FR (CATHETERS) ×3 IMPLANT
CLOTH BEACON ORANGE TIMEOUT ST (SAFETY) ×3 IMPLANT
CONTAINER PREFILL 10% NBF 60ML (FORM) ×6 IMPLANT
DEVICE MYOSURE LITE (MISCELLANEOUS) IMPLANT
DEVICE MYOSURE REACH (MISCELLANEOUS) IMPLANT
FILTER ARTHROSCOPY CONVERTOR (FILTER) ×3 IMPLANT
GLOVE BIO SURGEON STRL SZ 6.5 (GLOVE) ×2 IMPLANT
GLOVE BIO SURGEONS STRL SZ 6.5 (GLOVE) ×1
GLOVE BIOGEL PI IND STRL 7.0 (GLOVE) ×2 IMPLANT
GLOVE BIOGEL PI INDICATOR 7.0 (GLOVE) ×4
GOWN STRL REUS W/TWL LRG LVL3 (GOWN DISPOSABLE) ×6 IMPLANT
MYOSURE XL FIBROID REM (MISCELLANEOUS) ×3
PACK VAGINAL MINOR WOMEN LF (CUSTOM PROCEDURE TRAY) ×3 IMPLANT
PAD OB MATERNITY 4.3X12.25 (PERSONAL CARE ITEMS) ×3 IMPLANT
SEAL ROD LENS SCOPE MYOSURE (ABLATOR) ×3 IMPLANT
SYSTEM TISS REMOVAL MYSR XL RM (MISCELLANEOUS) ×1 IMPLANT
TOWEL OR 17X24 6PK STRL BLUE (TOWEL DISPOSABLE) ×6 IMPLANT
TUBING AQUILEX INFLOW (TUBING) ×3 IMPLANT
TUBING AQUILEX OUTFLOW (TUBING) ×3 IMPLANT
WATER STERILE IRR 1000ML POUR (IV SOLUTION) ×3 IMPLANT

## 2015-04-21 NOTE — Transfer of Care (Signed)
Immediate Anesthesia Transfer of Care Note  Patient: Carla Woodard  Procedure(s) Performed: Procedure(s): DILATATION & CURETTAGE/HYSTEROSCOPY WITH MYOSURE (N/A)  Patient Location: PACU  Anesthesia Type:General  Level of Consciousness: awake, alert  and oriented  Airway & Oxygen Therapy: Patient Spontanous Breathing and Patient connected to nasal cannula oxygen  Post-op Assessment: Report given to RN and Post -op Vital signs reviewed and stable  Post vital signs: Reviewed and stable  Last Vitals:  Filed Vitals:   04/21/15 0908  BP: 129/83  Pulse: 86  Temp: 36.7 C  Resp: 16    Complications: No apparent anesthesia complications

## 2015-04-21 NOTE — Anesthesia Preprocedure Evaluation (Addendum)
Anesthesia Evaluation  Patient identified by MRN, date of birth, ID band Patient awake    Reviewed: Allergy & Precautions, NPO status , Patient's Chart, lab work & pertinent test results  History of Anesthesia Complications Negative for: history of anesthetic complications  Airway Mallampati: II  TM Distance: >3 FB Neck ROM: Full    Dental  (+) Teeth Intact, Dental Advisory Given   Pulmonary neg pulmonary ROS,    Pulmonary exam normal breath sounds clear to auscultation       Cardiovascular Exercise Tolerance: Good hypertension (not on medications, improved with discontinuation of prior OCP), (-) angina(-) CAD and (-) Past MI Normal cardiovascular exam Rhythm:Regular Rate:Normal     Neuro/Psych negative neurological ROS  negative psych ROS   GI/Hepatic negative GI ROS, Neg liver ROS,   Endo/Other  negative endocrine ROS  Renal/GU negative Renal ROS     Musculoskeletal negative musculoskeletal ROS (+)   Abdominal   Peds  Hematology negative hematology ROS (+)   Anesthesia Other Findings Day of surgery medications reviewed with the patient.  Reproductive/Obstetrics negative OB ROS                           Anesthesia Physical Anesthesia Plan  ASA: II  Anesthesia Plan: General   Post-op Pain Management:    Induction: Intravenous  Airway Management Planned: LMA  Additional Equipment:   Intra-op Plan:   Post-operative Plan: Extubation in OR  Informed Consent: I have reviewed the patients History and Physical, chart, labs and discussed the procedure including the risks, benefits and alternatives for the proposed anesthesia with the patient or authorized representative who has indicated his/her understanding and acceptance.   Dental advisory given  Plan Discussed with: CRNA  Anesthesia Plan Comments: (Risks/benefits of general anesthesia discussed with patient including risk of  damage to teeth, lips, gum, and tongue, nausea/vomiting, allergic reactions to medications, and the possibility of heart attack, stroke and death.  All patient questions answered.  Patient wishes to proceed.)        Anesthesia Quick Evaluation

## 2015-04-21 NOTE — Interval H&P Note (Signed)
History and Physical Interval Note:  04/21/2015 10:42 AM  Carla Woodard  has presented today for surgery, with the diagnosis of AUB  The various methods of treatment have been discussed with the patient and family. After consideration of risks, benefits and other options for treatment, the patient has consented to  Procedure(s): [place] (N/A) as a surgical intervention .  The patient's history has been reviewed, patient examined, no change in status, stable for surgery.  I have reviewed the patient's chart and labs.  Questions were answered to the patient's satisfaction.     [name]

## 2015-04-21 NOTE — Anesthesia Postprocedure Evaluation (Signed)
Anesthesia Post Note  Patient: SIANY SORCE  Procedure(s) Performed: Procedure(s) (LRB): DILATATION & CURETTAGE/HYSTEROSCOPY WITH MYOSURE (N/A)  Patient location during evaluation: PACU Anesthesia Type: General Level of consciousness: awake and alert Pain management: pain level controlled Vital Signs Assessment: post-procedure vital signs reviewed and stable Respiratory status: spontaneous breathing, nonlabored ventilation, respiratory function stable and patient connected to nasal cannula oxygen Cardiovascular status: blood pressure returned to baseline and stable Postop Assessment: no signs of nausea or vomiting Anesthetic complications: no    Last Vitals:  Filed Vitals:   04/21/15 0908 04/21/15 1151  BP: 129/83   Pulse: 86   Temp: 36.7 C 37 C  Resp: 16     Last Pain:  Filed Vitals:   04/21/15 1213  PainSc: Wheeler Edward Maddy Graham

## 2015-04-21 NOTE — Discharge Instructions (Signed)

## 2015-04-21 NOTE — Op Note (Signed)
Preoperative Diagnosis: Abnormal uterine bleeding, fibroid uterus  Postoperative Diagnosis: Same  Procedure: Hysteroscopy, myomectomy, dilation and curettage  Surgeon: Dr Sumner Boast  Assistants: None  Anesthesia: General via LMA  EBL: 10 cc  Fluids: 1,700 cc LR  Fluid deficit: 55  Urine output: not recorded  Indications for surgery: The patient is a 44 yo female, who presented with abnormal uterine bleeding on micronor. Work up included a sonohysterogram that revealed an intracavitary myoma The risks of the surgery were reviewed with the patient and the consent form was signed prior to her surgery.  Findings: Hysteroscopy: 2 cm intracavitary myoma, small myoma deviating the cavity anteriorly, normal tubal ostia bilaterally. After the procedure the cavity was empthy  Specimens: Myoma, endometrial curetting's    Procedure: The patient was taken to the operating room with an IV in place. She was placed in the dorsal lithotomy position and anesthesia was administered. She was prepped and draped in the usual sterile fashion for a vaginal procedure. She was cathed. A weighted speculum was placed in the vagina and a single tooth tenaculum was placed on the anterior lip of the cervix. The cervix was dilated to a #8 hagar dilator. The uterus was sounded to 9 cm. The myosure hysteroscope was inserted into the uterine cavity. With continuous infusion of normal saline, the uterine cavity was visualized with the above findings. The myosure XL resectoscope was used to remove the intracavitary myoma and shave down the other anterior myoma. The myosure was then removed. The cavity was then curetted with the small sharp curette. The cavity had the characteristically gritty texture at the end of the procedure. The hysteroscope was reinserted into the uterine cavity, the cavity was empty. The myosure and the single tooth tenaculum were removed. The speculum was removed. The patients perineum was cleansed  of betadine and she was taken out of the dorsal lithotomy position.  Upon awakening the LMA was removed and the patient was transferred to the recovery room in stable and awake condition.  The sponge and instrument count were correct. There were no complications.

## 2015-04-22 ENCOUNTER — Encounter (HOSPITAL_COMMUNITY): Payer: Self-pay | Admitting: Obstetrics and Gynecology

## 2015-04-23 ENCOUNTER — Encounter: Payer: Self-pay | Admitting: Endocrinology

## 2015-04-23 ENCOUNTER — Ambulatory Visit (INDEPENDENT_AMBULATORY_CARE_PROVIDER_SITE_OTHER): Payer: BC Managed Care – PPO | Admitting: Endocrinology

## 2015-04-23 ENCOUNTER — Other Ambulatory Visit (HOSPITAL_COMMUNITY)
Admission: RE | Admit: 2015-04-23 | Discharge: 2015-04-23 | Disposition: A | Discharge status: Home / Self Care | Payer: BC Managed Care – PPO | Source: Ambulatory Visit | Attending: Endocrinology | Admitting: Endocrinology

## 2015-04-23 VITALS — BP 126/84 | HR 63 | Temp 99.1°F | Ht 64.5 in | Wt 159.0 lb

## 2015-04-23 DIAGNOSIS — E041 Nontoxic single thyroid nodule: Secondary | ICD-10-CM

## 2015-04-23 NOTE — Patient Instructions (Signed)
We'll let you know about the results of the biopsy. If no cancer is seen, Please come back for a follow-up appointment in 6-12 months. most of the time, a "lumpy thyroid" will eventually become overactive.  this is usually a slow process, happening over the span of many years.

## 2015-04-23 NOTE — Progress Notes (Signed)
Subjective:    Patient ID: Carla Woodard, female    DOB: 09-Mar-1971, 44 y.o.   MRN: [id number]  HPI In nov of 2016, at a routine GYN ov, pt was noted to have a nodule at the right side of the thyroid.  she is unaware of ever having had thyroid problems in the past.  she has no h/o XRT or surgery to the neck.  She has slight congestion sensation in the throat, but no assoc pain.  Past Medical History  Diagnosis Date  . Abnormal Pap smear   . Hypertension     not currently on meds    Past Surgical History  Procedure Laterality Date  . Cervix lesion destruction      ?  Marland Kitchen Eye surgery    . Vulva surgery  07/20/10    bilateral revision labia minora  . Dilatation & curettage/hysteroscopy with myosure N/A 04/21/2015    Procedure: DILATATION & CURETTAGE/HYSTEROSCOPY WITH MYOSURE;  Surgeon: [name], MD;  Location: [facility];  Service: Gynecology;  Laterality: N/A;    Social History   Social History  . Marital Status: Married    Spouse Name: N/A  . Number of Children: N/A  . Years of Education: N/A   Occupational History  . Not on file.   Social History Main Topics  . Smoking status: Never Smoker   . Smokeless tobacco: Never Used  . Alcohol Use: 0.6 - 1.2 oz/week    1-2 Glasses of wine per week  . Drug Use: No  . Sexual Activity:    Partners: Male    Birth Control/ Protection: Pill   Other Topics Concern  . Not on file   Social History Narrative    Current Outpatient Prescriptions on File Prior to Visit  Medication Sig Dispense Refill  . ibuprofen (ADVIL,MOTRIN) 200 MG tablet Take 400 mg by mouth every 6 (six) hours as needed for moderate pain.    Marland Kitchen MELATONIN PO Take 1 tablet by mouth at bedtime.    . Multiple Vitamins-Minerals (MULTIVITAMIN PO) Take 1 tablet by mouth daily.    . norethindrone (MICRONOR,CAMILA,ERRIN) 0.35 MG tablet Take 1 tablet (0.35 mg total) by mouth daily. 3 Package 4  . zolpidem (AMBIEN) 10 MG tablet TAKE 1/2 TABLET AT BEDTIME AS NEEDED  FOR SLEEP (Patient taking differently: Take 5 mg by mouth at bedtime as needed for sleep. TAKE 1/2 TABLET AT BEDTIME AS NEEDED FOR SLEEP) 30 tablet 2   No current facility-administered medications on file prior to visit.    Allergies  Allergen Reactions  . Latex Rash    Family History  Problem Relation Age of Onset  . Heart disease Mother   . Cirrhosis Mother   . Heart disease Maternal Grandmother     pacemaker  . Stroke Maternal Grandmother   . Diabetes Maternal Aunt   . Lung cancer Paternal Grandfather   . Aneurysm Father   . Thyroid disease Neg Hx     BP 126/84 mmHg  Pulse 63  Temp(Src) 99.1 F (37.3 C) (Oral)  Ht 5' 4.5" (1.638 m)  Wt 159 lb (72.122 kg)  BMI 26.88 kg/m2  SpO2 99%  LMP 04/02/2015  Review of Systems Denies weight change, hoarseness, visual loss, chest pain, sob, cough, dysphagia, skin rash, easy bruising, depression, cold intolerance, numbness, and rhinorrhea.  She has intermittent headache.      Objective:   Physical Exam VS: see vs page GEN: no distress HEAD: head: no deformity eyes: no  periorbital swelling, no proptosis external nose and ears are normal mouth: no lesion seen NECK: the right thyroid nodule is easily palpable.   CHEST WALL: no deformity LUNGS:  Clear to auscultation CV: reg rate and rhythm, no murmur ABD: abdomen is soft, nontender.  no hepatosplenomegaly.  not distended.  no hernia MUSCULOSKELETAL: muscle bulk and strength are grossly normal.  no obvious joint swelling.  gait is normal and steady EXTEMITIES: no edema.  PULSES: no carotid bruit NEURO:  cn 2-12 grossly intact.   readily moves all 4's.  sensation is intact to touch on all 4's.  SKIN:  Normal texture and temperature.  No rash or suspicious lesion is visible.   NODES:  None palpable at the neck PSYCH: alert, well-oriented.  Does not appear anxious nor depressed.   [place]: 2.5 cm right inferior pole complex solid cystic nodule.  Lab Results  Component Value Date    TSH 1.264 03/18/2015   T4TOTAL 6.6 03/18/2015   thyroid needle bx: consent obtained, signed form on chart The area is first sprayed with cooling agent local: xylocaine 2%, with epinephrine prep: alcohol pad 4 bxs are done with 25 and [id number] needles no complications     Assessment & Plan:  Multinodular goiter, new, uncertain etiology  Patient is advised the following: Patient Instructions  We'll let you know about the results of the biopsy. If no cancer is seen, Please come back for a follow-up appointment in 6-12 months. most of the time, a "lumpy thyroid" will eventually become overactive.  this is usually a slow process, happening over the span of many years.

## 2015-04-28 ENCOUNTER — Telehealth: Payer: Self-pay | Admitting: Obstetrics and Gynecology

## 2015-04-28 ENCOUNTER — Ambulatory Visit: Payer: BC Managed Care – PPO | Admitting: Endocrinology

## 2015-04-28 DIAGNOSIS — N83202 Unspecified ovarian cyst, left side: Secondary | ICD-10-CM

## 2015-04-28 NOTE — Telephone Encounter (Signed)
Spoke with pt regarding benefit for ultrasound. Patient understood and agreeable. Patient ready to schedule. Patient scheduled 05/27/15 with Dr. Talbert Nan. Pt aware of arrival date and time. Pt aware of 72 hours cancellation policy with [id number] fee. No further questions. Ok to close

## 2015-05-07 ENCOUNTER — Ambulatory Visit (INDEPENDENT_AMBULATORY_CARE_PROVIDER_SITE_OTHER): Payer: BC Managed Care – PPO | Admitting: Obstetrics and Gynecology

## 2015-05-07 ENCOUNTER — Encounter: Payer: Self-pay | Admitting: Obstetrics and Gynecology

## 2015-05-07 VITALS — BP 106/70 | HR 60 | Resp 18 | Ht 64.5 in | Wt 164.0 lb

## 2015-05-07 DIAGNOSIS — Z9889 Other specified postprocedural states: Secondary | ICD-10-CM

## 2015-05-07 NOTE — Progress Notes (Signed)
GYNECOLOGY  VISIT   HPI: 44 y.o.   Married  Caucasian  female   [place] with Patient's last menstrual period was 04/21/2015.  here for [place], hysteroscopic myomectomy pathology was benign. She is on micronor. She only had light spotting for a day or two after the procedure. Feeling fine no pain.  GYNECOLOGIC HISTORY: Patient's last menstrual period was 04/21/2015. Contraception: OCP Menopausal hormone therapy: None        OB History    Gravida Para Term Preterm AB TAB SAB Ectopic Multiple Living   1 1 1       1          Patient Active Problem List   Diagnosis Date Noted  . Right thyroid nodule 04/23/2015  . Essential hypertension 02/15/2014    Class: History of  . Vasovagal syncope 11/23/2013  . Anxiety 11/23/2013  . Insomnia 11/23/2013    Past Medical History  Diagnosis Date  . Abnormal Pap smear   . Hypertension     not currently on meds    Past Surgical History  Procedure Laterality Date  . Cervix lesion destruction      ?  Marland Kitchen Eye surgery    . Vulva surgery  07/20/10    bilateral revision labia minora  . Dilatation & curettage/hysteroscopy with myosure N/A 04/21/2015    Procedure: DILATATION & CURETTAGE/HYSTEROSCOPY WITH MYOSURE;  Surgeon: [name], MD;  Location: [facility];  Service: Gynecology;  Laterality: N/A;    Current Outpatient Prescriptions  Medication Sig Dispense Refill  . ibuprofen (ADVIL,MOTRIN) 200 MG tablet Take 400 mg by mouth every 6 (six) hours as needed for moderate pain.    Marland Kitchen MELATONIN PO Take 1 tablet by mouth at bedtime.    . Multiple Vitamins-Minerals (MULTIVITAMIN PO) Take 1 tablet by mouth daily.    . norethindrone (MICRONOR,CAMILA,ERRIN) 0.35 MG tablet Take 1 tablet (0.35 mg total) by mouth daily. 3 Package 4  . zolpidem (AMBIEN) 10 MG tablet TAKE 1/2 TABLET AT BEDTIME AS NEEDED FOR SLEEP (Patient taking differently: Take 5 mg by mouth at bedtime as needed for sleep. TAKE 1/2 TABLET AT  BEDTIME AS NEEDED FOR SLEEP) 30 tablet 2   No current facility-administered medications for this visit.     ALLERGIES: Latex  Family History  Problem Relation Age of Onset  . Heart disease Mother   . Cirrhosis Mother   . Heart disease Maternal Grandmother     pacemaker  . Stroke Maternal Grandmother   . Diabetes Maternal Aunt   . Lung cancer Paternal Grandfather   . Aneurysm Father   . Thyroid disease Neg Hx     Social History   Social History  . Marital Status: Married    Spouse Name: N/A  . Number of Children: N/A  . Years of Education: N/A   Occupational History  . Not on file.   Social History Main Topics  . Smoking status: Never Smoker   . Smokeless tobacco: Never Used  . Alcohol Use: 0.6 - 1.2 oz/week    1-2 Glasses of wine per week  . Drug Use: No  . Sexual Activity:    Partners: Male    Birth Control/ Protection: Pill   Other Topics Concern  . Not on file   Social History Narrative    Review of Systems  All other systems reviewed and are negative.   PHYSICAL EXAMINATION:    BP 106/70 mmHg  Pulse 60  Resp 18  Ht  5' 4.5" (1.638 m)  Wt 164 lb (74.39 kg)  BMI 27.73 kg/m2  LMP 04/21/2015    General appearance: alert, cooperative and appears stated age Abdomen: soft, non-tender; bowel sounds normal; no masses,  no organomegaly    ASSESSMENT S/P hysteroscopic myomectomy, doing well H/O left ovarian cyst, likely hemorraghic CL Thyroid nodule, negative biopsy   PLAN Continue micronor Calendar cycles F/U ultrasound next month   An After Visit Summary was printed and given to the patient.

## 2015-05-19 ENCOUNTER — Telehealth: Payer: Self-pay | Admitting: Family Medicine

## 2015-05-20 MED ORDER — ZOLPIDEM TARTRATE 10 MG PO TABS: 10.0000 mg | ORAL_TABLET | Freq: Every evening | ORAL | Status: DC | PRN

## 2015-05-20 NOTE — Telephone Encounter (Signed)
I don't understand I just went back and looked at the last Rx and it was for 30 pills with 2 refills of the 10 mg pills and the patient instructions are to only take half a tablet, so according to my records that should give her enough for 60 days. I guess I am confused why she got 15 pills because my Rx that we printed said 30 pills

## 2015-05-20 NOTE — Telephone Encounter (Signed)
Patient states that she is taking 1 at bedtime most nights and 1/2 occasionally. So patient is asking if we can change the instructions to 1 at bedtime.

## 2015-05-20 NOTE — Telephone Encounter (Signed)
Yes that is fine to change the instructions to 1 at bedtime, I still don't get how the prescription would come out at 15 and set of 30 though because I wrote 30 on the prescription but if she needs new prescription same 30 pills one at bedtime I'm fine with that. Caryl Pina, MD [facility] 05/20/2015, 8:55 AM

## 2015-05-20 NOTE — Telephone Encounter (Signed)
Patient aware and medication called in for patient.

## 2015-05-27 ENCOUNTER — Ambulatory Visit (INDEPENDENT_AMBULATORY_CARE_PROVIDER_SITE_OTHER): Payer: BC Managed Care – PPO | Admitting: Obstetrics and Gynecology

## 2015-05-27 ENCOUNTER — Other Ambulatory Visit: Payer: BC Managed Care – PPO

## 2015-05-27 ENCOUNTER — Encounter: Payer: Self-pay | Admitting: Obstetrics and Gynecology

## 2015-05-27 ENCOUNTER — Ambulatory Visit (INDEPENDENT_AMBULATORY_CARE_PROVIDER_SITE_OTHER): Payer: BC Managed Care – PPO

## 2015-05-27 VITALS — BP 122/80 | HR 80 | Resp 16 | Wt 161.0 lb

## 2015-05-27 DIAGNOSIS — N83209 Unspecified ovarian cyst, unspecified side: Secondary | ICD-10-CM

## 2015-05-27 DIAGNOSIS — N83202 Unspecified ovarian cyst, left side: Secondary | ICD-10-CM

## 2015-05-27 NOTE — Progress Notes (Signed)
GYNECOLOGY  VISIT   HPI: 44 y.o.   Married  Caucasian  female   G1P1001 with Carla Woodard's last menstrual period was 05/14/2015.   here for  Pelvic U/S to f/u on an ovarian cyst. She has been doing well since her hysteroscopic myomectomy. Only minimal bleeding on the micronor. She is considering getting the mirena IUD.   GYNECOLOGIC HISTORY: Carla Woodard's last menstrual period was 05/14/2015. Contraception:OCP Menopausal hormone therapy: None        OB History    Gravida Para Term Preterm AB TAB SAB Ectopic Multiple Living   1 1 1       1          Carla Woodard Active Problem List   Diagnosis Date Noted  . Right thyroid nodule 04/23/2015  . Essential hypertension 02/15/2014    Class: History of  . Vasovagal syncope 11/23/2013  . Anxiety 11/23/2013  . Insomnia 11/23/2013    Past Medical History  Diagnosis Date  . Abnormal Pap smear   . Hypertension     not currently on meds    Past Surgical History  Procedure Laterality Date  . Cervix lesion destruction      ?  Marland Kitchen Eye surgery    . Vulva surgery  07/20/10    bilateral revision labia minora  . Dilatation & curettage/hysteroscopy with myosure N/A 04/21/2015    Procedure: DILATATION & CURETTAGE/HYSTEROSCOPY WITH MYOSURE;  Surgeon: [name], MD;  Location: [facility];  Service: Gynecology;  Laterality: N/A;    Current Outpatient Prescriptions  Medication Sig Dispense Refill  . ibuprofen (ADVIL,MOTRIN) 200 MG tablet Take 400 mg by mouth every 6 (six) hours as needed for moderate pain.    Marland Kitchen MELATONIN PO Take 1 tablet by mouth at bedtime.    . Multiple Vitamins-Minerals (MULTIVITAMIN PO) Take 1 tablet by mouth daily.    . norethindrone (MICRONOR,CAMILA,ERRIN) 0.35 MG tablet Take 1 tablet (0.35 mg total) by mouth daily. 3 Package 4  . zolpidem (AMBIEN) 10 MG tablet Take 1 tablet (10 mg total) by mouth at bedtime as needed for sleep. 30 tablet 2   No current facility-administered medications for this visit.     ALLERGIES:  Latex  Family History  Problem Relation Age of Onset  . Heart disease Mother   . Cirrhosis Mother   . Heart disease Maternal Grandmother     pacemaker  . Stroke Maternal Grandmother   . Diabetes Maternal Aunt   . Lung cancer Paternal Grandfather   . Aneurysm Father   . Thyroid disease Neg Hx     Social History   Social History  . Marital Status: Married    Spouse Name: N/A  . Number of Children: N/A  . Years of Education: N/A   Occupational History  . Not on file.   Social History Main Topics  . Smoking status: Never Smoker   . Smokeless tobacco: Never Used  . Alcohol Use: 0.6 - 1.2 oz/week    1-2 Glasses of wine per week  . Drug Use: No  . Sexual Activity:    Partners: Male    Birth Control/ Protection: Pill   Other Topics Concern  . Not on file   Social History Narrative    Review of Systems  Constitutional: Negative.   HENT: Negative.   Eyes: Negative.   Respiratory: Negative.   Cardiovascular: Negative.   Gastrointestinal: Negative.   Genitourinary: Negative.   Musculoskeletal: Negative.   Skin: Negative.   Neurological: Negative.  Endo/Heme/Allergies: Negative.   Psychiatric/Behavioral: Negative.     PHYSICAL EXAMINATION:    BP 122/80 mmHg  Pulse 80  Resp 16  Wt 161 lb (73.029 kg)  LMP 05/14/2015    General appearance: alert, cooperative and appears stated age  Reviewed ultrasound pictures with the Carla Woodard. Prior ovarian cyst has resolved, she has 2 new cysts.   ASSESSMENT Ovarian cyst, resolved and restarted Doing well since her hysteroscopic myomectomy    PLAN Routine f/u   An After Visit Summary was printed and given to the Carla Woodard.

## 2015-05-28 ENCOUNTER — Telehealth: Payer: Self-pay | Admitting: Obstetrics and Gynecology

## 2015-07-04 NOTE — Telephone Encounter (Signed)
error 

## 2015-12-01 ENCOUNTER — Other Ambulatory Visit: Payer: Self-pay | Admitting: Family Medicine

## 2016-02-27 ENCOUNTER — Encounter: Payer: Self-pay | Admitting: Certified Nurse Midwife

## 2016-02-27 ENCOUNTER — Ambulatory Visit (INDEPENDENT_AMBULATORY_CARE_PROVIDER_SITE_OTHER): Payer: BC Managed Care – PPO | Admitting: Certified Nurse Midwife

## 2016-02-27 VITALS — BP 110/70 | HR 70 | Resp 16 | Ht 64.75 in | Wt 170.0 lb

## 2016-02-27 DIAGNOSIS — Z23 Encounter for immunization: Secondary | ICD-10-CM | POA: Diagnosis not present

## 2016-02-27 DIAGNOSIS — Z01419 Encounter for gynecological examination (general) (routine) without abnormal findings: Secondary | ICD-10-CM | POA: Diagnosis not present

## 2016-02-27 DIAGNOSIS — Z0000 Encounter for general adult medical examination without abnormal findings: Secondary | ICD-10-CM | POA: Diagnosis not present

## 2016-02-27 DIAGNOSIS — Z3041 Encounter for surveillance of contraceptive pills: Secondary | ICD-10-CM | POA: Diagnosis not present

## 2016-02-27 LAB — LIPID PANEL
CHOL/HDL RATIO: 2.1 ratio (ref <=5.0)
Cholesterol: 164 mg/dL (ref 125–200)
HDL: 80 mg/dL (ref >=46)
LDL Cholesterol: 74 mg/dL (ref <130)
Triglycerides: 52 mg/dL (ref <150)
VLDL: 10 mg/dL (ref <30)

## 2016-02-27 LAB — COMPREHENSIVE METABOLIC PANEL
ALBUMIN: 4.4 g/dL (ref 3.6–5.1)
ALT: 15 U/L (ref 6–29)
AST: 26 U/L (ref 10–35)
Alkaline Phosphatase: 61 U/L (ref 33–115)
BUN: 10 mg/dL (ref 7–25)
CALCIUM: 9.4 mg/dL (ref 8.6–10.2)
CHLORIDE: 102 mmol/L (ref 98–110)
CO2: 27 mmol/L (ref 20–31)
Creat: 0.91 mg/dL (ref 0.50–1.10)
GLUCOSE: 92 mg/dL (ref 65–99)
Potassium: 4.3 mmol/L (ref 3.5–5.3)
SODIUM: 140 mmol/L (ref 135–146)
Total Bilirubin: 0.7 mg/dL (ref 0.2–1.2)
Total Protein: 6.8 g/dL (ref 6.1–8.1)

## 2016-02-27 LAB — TSH: TSH: 0.96 mIU/L

## 2016-02-27 LAB — POCT URINALYSIS DIPSTICK
Bilirubin, UA: NEGATIVE
Blood, UA: NEGATIVE
GLUCOSE UA: NEGATIVE
KETONES UA: NEGATIVE
LEUKOCYTES UA: NEGATIVE
Nitrite, UA: NEGATIVE
PROTEIN UA: NEGATIVE
Urobilinogen, UA: NEGATIVE
pH, UA: 5

## 2016-02-27 MED ORDER — NORETHINDRONE 0.35 MG PO TABS: 1.0000 | ORAL_TABLET | Freq: Every day | ORAL | 4 refills | Status: DC

## 2016-02-27 NOTE — Progress Notes (Signed)
45 y.o. G38P1001 Married  Caucasian Fe here for annual exam. Periods normal, no issues, now since evaluation and surgery with Dr.Jertson. Contraception working well. Desires continuance of POP, but would consider IUD. Desires screening labs. Sees PCP prn. Has CDL certification now and job change! No other health issues today. Planning trip to [place] for anniversary.  Patient's last menstrual period was 02/07/2016 (exact date).          Sexually active: Yes.    The current method of family planning is oral progesterone-only contraceptive.    Exercising: Yes.    weight & cardio Smoker:  no  Health Maintenance: Pap:  08-20-14 neg HPV HR neg MMG:  10-01-13 category 2 denity neg. Scheduled for tuesday Colonoscopy:  none BMD:   none TDaP:  2007 Shingles: no Pneumonia: no Hep C and HIV: not done Labs: poct urine-neg Self breast exam: done rarely   reports that she has never smoked. She has never used smokeless tobacco. She reports that she drinks about 0.6 - 1.2 oz of alcohol per week . She reports that she does not use drugs.  Past Medical History:  Diagnosis Date  . Abnormal Pap smear   . Hypertension    not currently on meds    Past Surgical History:  Procedure Laterality Date  . CERVIX LESION DESTRUCTION     ?  Marland Kitchen DILATATION & CURETTAGE/HYSTEROSCOPY WITH MYOSURE N/A 04/21/2015   Procedure: DILATATION & CURETTAGE/HYSTEROSCOPY WITH MYOSURE;  Surgeon: [name], MD;  Location: [facility];  Service: Gynecology;  Laterality: N/A;  . EYE SURGERY    . VULVA SURGERY  07/20/10   bilateral revision labia minora    Current Outpatient Prescriptions  Medication Sig Dispense Refill  . ibuprofen (ADVIL,MOTRIN) 200 MG tablet Take 400 mg by mouth every 6 (six) hours as needed for moderate pain.    Marland Kitchen MELATONIN PO Take 1 tablet by mouth at bedtime.    . Multiple Vitamins-Minerals (MULTIVITAMIN PO) Take 1 tablet by mouth daily.    . norethindrone (MICRONOR,CAMILA,ERRIN) 0.35 MG tablet Take 1  tablet (0.35 mg total) by mouth daily. 3 Package 4  . zolpidem (AMBIEN) 10 MG tablet Take 1 tablet (10 mg total) by mouth at bedtime as needed for sleep. 30 tablet 2   No current facility-administered medications for this visit.     Family History  Problem Relation Age of Onset  . Heart disease Mother   . Cirrhosis Mother   . Heart disease Maternal Grandmother     pacemaker  . Stroke Maternal Grandmother   . Diabetes Maternal Aunt   . Lung cancer Paternal Grandfather   . Aneurysm Father   . Thyroid disease Neg Hx     ROS:  Pertinent items are noted in HPI.  Otherwise, a comprehensive ROS was negative.  Exam:   BP 110/70   Pulse 70   Resp 16   Ht 5' 4.75" (1.645 m)   Wt 170 lb (77.1 kg)   LMP 02/07/2016 (Exact Date)   BMI 28.51 kg/m  Height: 5' 4.75" (164.5 cm) Ht Readings from Last 3 Encounters:  02/27/16 5' 4.75" (1.645 m)  05/07/15 5' 4.5" (1.638 m)  04/23/15 5' 4.5" (1.638 m)    General appearance: alert, cooperative and appears stated age Head: Normocephalic, without obvious abnormality, atraumatic Neck: no adenopathy, supple, symmetrical, trachea midline and thyroid normal to inspection and palpation and nodular Lungs: clear to auscultation bilaterally Breasts: normal appearance, no masses or tenderness, No nipple retraction or dimpling,  No nipple discharge or bleeding, No axillary or supraclavicular adenopathy Heart: regular rate and rhythm Abdomen: soft, non-tender; no masses,  no organomegaly Extremities: extremities normal, atraumatic, no cyanosis or edema Skin: Skin color, texture, turgor normal. No rashes or lesions Lymph nodes: Cervical, supraclavicular, and axillary nodes normal. No abnormal inguinal nodes palpated Neurologic: Grossly normal   Pelvic: External genitalia:  no lesions              Urethra:  normal appearing urethra with no masses, tenderness or lesions              Bartholin's and Skene's: normal                 Vagina: normal appearing  vagina with normal color and discharge, no lesions              Cervix: no cervical motion tenderness, no lesions, nulliparous appearance and slight blood from menses noted              Pap taken: No. Bimanual Exam:  Uterus:  normal size, contour, position, consistency, mobility, non-tender, slight enlargement, history of fibroids,               Adnexa: normal adnexa and no mass, fullness, tenderness               Rectovaginal: Confirms               Anus:  normal sphincter tone, no lesions  Chaperone present: yes  A:  Well Woman with normal exam  Contraception POP desired, but interested in IUD if possible with fibroid history  Thyroid nodule with negative biopsy  Screening labs  Immunization update  P:   Reviewed health and wellness pertinent to exam  Discussed risks benefits/insertion/removal and bleeding expectations with IUD. Questions addressed. Will check with Dr. Talbert Nan on if possible for use and patient will be advised.  Rx Micronor see order  Continue follow up with Dr. Loanne Drilling  Lab: Hep C, Lipid panel,CMP, TSH, Vitamin D  Requests TDAP  Pap smear as above not taken   counseled on breast self exam, mammography screening, use and side effects of OCP's, adequate intake of calcium and vitamin D, diet and exercise  return annually or prn  An After Visit Summary was printed and given to the patient.

## 2016-02-27 NOTE — Patient Instructions (Signed)
EXERCISE AND DIET:  We recommended that you start or continue a regular exercise program for good health. Regular exercise means any activity that makes your heart beat faster and makes you sweat.  We recommend exercising at least 30 minutes per day at least 3 days a week, preferably 4 or 5.  We also recommend a diet low in fat and sugar.  Inactivity, poor dietary choices and obesity can cause diabetes, heart attack, stroke, and kidney damage, among others.    ALCOHOL AND SMOKING:  Women should limit their alcohol intake to no more than 7 drinks/beers/glasses of wine (combined, not each!) per week. Moderation of alcohol intake to this level decreases your risk of breast cancer and liver damage. And of course, no recreational drugs are part of a healthy lifestyle.  And absolutely no smoking or even second hand smoke. Most people know smoking can cause heart and lung diseases, but did you know it also contributes to weakening of your bones? Aging of your skin?  Yellowing of your teeth and nails?  CALCIUM AND VITAMIN D:  Adequate intake of calcium and Vitamin D are recommended.  The recommendations for exact amounts of these supplements seem to change often, but generally speaking 600 mg of calcium (either carbonate or citrate) and 800 units of Vitamin D per day seems prudent. Certain women may benefit from higher intake of Vitamin D.  If you are among these women, your doctor will have told you during your visit.    PAP SMEARS:  Pap smears, to check for cervical cancer or precancers,  have traditionally been done yearly, although recent scientific advances have shown that most women can have pap smears less often.  However, every woman still should have a physical exam from her gynecologist every year. It will include a breast check, inspection of the vulva and vagina to check for abnormal growths or skin changes, a visual exam of the cervix, and then an exam to evaluate the size and shape of the uterus and  ovaries.  And after 47 years of age, a rectal exam is indicated to check for rectal cancers. We will also provide age appropriate advice regarding health maintenance, like when you should have certain vaccines, screening for sexually transmitted diseases, bone density testing, colonoscopy, mammograms, etc.   MAMMOGRAMS:  All women over 40 years old should have a yearly mammogram. Many facilities now offer a "3D" mammogram, which may cost around $50 extra out of pocket. If possible,  we recommend you accept the option to have the 3D mammogram performed.  It both reduces the number of women who will be called back for extra views which then turn out to be normal, and it is better than the routine mammogram at detecting truly abnormal areas.    COLONOSCOPY:  Colonoscopy to screen for colon cancer is recommended for all women at age 50.  We know, you hate the idea of the prep.  We agree, BUT, having colon cancer and not knowing it is worse!!  Colon cancer so often starts as a polyp that can be seen and removed at colonscopy, which can quite literally save your life!  And if your first colonoscopy is normal and you have no family history of colon cancer, most women don't have to have it again for 10 years.  Once every ten years, you can do something that may end up saving your life, right?  We will be happy to help you get it scheduled when you are ready.    Be sure to check your insurance coverage so you understand how much it will cost.  It may be covered as a preventative service at no cost, but you should check your particular policy.     Intrauterine Device Information An intrauterine device (IUD) is inserted into your uterus to prevent pregnancy. There are two types of IUDs available:   Copper IUD--This type of IUD is wrapped in copper wire and is placed inside the uterus. Copper makes the uterus and fallopian tubes produce a fluid that kills sperm. The copper IUD can stay in place for 10 years.  Hormone  IUD--This type of IUD contains the hormone progestin (synthetic progesterone). The hormone thickens the cervical mucus and prevents sperm from entering the uterus. It also thins the uterine lining to prevent implantation of a fertilized egg. The hormone can weaken or kill the sperm that get into the uterus. One type of hormone IUD can stay in place for 5 years, and another type can stay in place for 3 years. Your health care provider will make sure you are a good candidate for a contraceptive IUD. Discuss with your health care provider the possible side effects.  ADVANTAGES OF AN INTRAUTERINE DEVICE  IUDs are highly effective, reversible, long acting, and low maintenance.   There are no estrogen-related side effects.   An IUD can be used when breastfeeding.   IUDs are not associated with weight gain.   The copper IUD works immediately after insertion.   The hormone IUD works right away if inserted within 7 days of your period starting. You will need to use a backup method of birth control for 7 days if the hormone IUD is inserted at any other time in your cycle.  The copper IUD does not interfere with your female hormones.   The hormone IUD can make heavy menstrual periods lighter and decrease cramping.   The hormone IUD can be used for 3 or 5 years.   The copper IUD can be used for 10 years. DISADVANTAGES OF AN INTRAUTERINE DEVICE  The hormone IUD can be associated with irregular bleeding patterns.   The copper IUD can make your menstrual flow heavier and more painful.   You may experience cramping and vaginal bleeding after insertion.    This information is not intended to replace advice given to you by your health care provider. Make sure you discuss any questions you have with your health care provider.   Document Released: 03/30/2004 Document Revised: 12/27/2012 Document Reviewed: 10/15/2012 Elsevier Interactive Patient Education 2016 Elsevier Inc.  

## 2016-02-28 LAB — HEPATITIS C ANTIBODY: HCV Ab: NEGATIVE

## 2016-02-28 LAB — VITAMIN D 25 HYDROXY (VIT D DEFICIENCY, FRACTURES): Vit D, 25-Hydroxy: 37 ng/mL (ref 30–100)

## 2016-03-02 ENCOUNTER — Encounter: Payer: BC Managed Care – PPO | Admitting: *Deleted

## 2016-03-05 NOTE — Progress Notes (Signed)
Encounter reviewed. The patient would be a candidate for an IUD. 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 She just has slight deviation of her cavity from another myoma (minimal). Her cavity sounded to 9 cm. I think a mirena or kyleena IUD is a good option for her. Sumner Boast, MD

## 2016-03-09 ENCOUNTER — Other Ambulatory Visit: Payer: Self-pay | Admitting: Certified Nurse Midwife

## 2016-03-09 ENCOUNTER — Telehealth: Payer: Self-pay

## 2016-03-09 DIAGNOSIS — G4709 Other insomnia: Secondary | ICD-10-CM

## 2016-03-09 DIAGNOSIS — Z3009 Encounter for other general counseling and advice on contraception: Secondary | ICD-10-CM

## 2016-03-09 DIAGNOSIS — Z3041 Encounter for surveillance of contraceptive pills: Secondary | ICD-10-CM

## 2016-03-09 MED ORDER — ZOLPIDEM TARTRATE 10 MG PO TABS: 10.0000 mg | ORAL_TABLET | Freq: Every evening | ORAL | 2 refills | Status: DC | PRN

## 2016-03-09 NOTE — Progress Notes (Signed)
Please call patient and let her know I discussed with Dr. Talbert Nan if IUD possible with her fibroid history and she reviewed her [place] results and feels this would be a good option. I will place order and let her know that she will be called with insurance information and need to call when she is on period, needs to be inserted day 1-5. She will be given instructions when she calls.

## 2016-03-09 NOTE — Telephone Encounter (Signed)
Refill completed.

## 2016-03-09 NOTE — Progress Notes (Signed)
Pt notified. Pt aware.

## 2016-03-09 NOTE — Progress Notes (Signed)
Only for prn use due to snoring from spouse and wakefullness

## 2016-03-09 NOTE — Telephone Encounter (Signed)
Pt aware she will be called with iud precert info. Pt states she may be out of town in November when her cycle starts so it may be December before she is able to get it. Pt also states she checked with the pharmacy & her Lorrin Mais wasn't sent to pharmacy. Pt states you told her you would send it. Pt only takes it occ when out of town & when husband snores so loud that she cant sleep. Please advise.

## 2016-04-09 HISTORY — PX: INTRAUTERINE DEVICE (IUD) INSERTION: SHX5877

## 2016-04-26 ENCOUNTER — Telehealth: Payer: Self-pay | Admitting: Certified Nurse Midwife

## 2016-04-26 NOTE — Telephone Encounter (Signed)
Patient has started her cycle and was told to call to schedule her IUD insertion. Patient is available any time today, tomorrow afternoon, or Wednesday.

## 2016-04-26 NOTE — Telephone Encounter (Signed)
Spoke with patient. Patient states that she started her menses on 04/24/2016 and would like to schedule her IUD insertion. Appointment scheduled for 04/29/2016 at 1 pm with Dr.Jertson. Pre procedure instructions given.  Motrin instructions given. Motrin=Advil=Ibuprofen, 800 mg one hour before appointment. Eat a meal and hydrate well before appointment.   Routing to provider for final review. Patient agreeable to disposition. Will close encounter.

## 2016-04-27 ENCOUNTER — Telehealth: Payer: Self-pay | Admitting: Obstetrics & Gynecology

## 2016-04-27 ENCOUNTER — Other Ambulatory Visit: Payer: Self-pay

## 2016-04-27 NOTE — Progress Notes (Signed)
Encounter opened in error

## 2016-04-27 NOTE — Telephone Encounter (Signed)
Opened in error

## 2016-04-29 ENCOUNTER — Encounter: Payer: Self-pay | Admitting: Obstetrics and Gynecology

## 2016-04-29 ENCOUNTER — Ambulatory Visit (INDEPENDENT_AMBULATORY_CARE_PROVIDER_SITE_OTHER): Payer: BC Managed Care – PPO | Admitting: Obstetrics and Gynecology

## 2016-04-29 VITALS — BP 118/70 | HR 76 | Resp 14 | Wt 169.0 lb

## 2016-04-29 DIAGNOSIS — Z30430 Encounter for insertion of intrauterine contraceptive device: Secondary | ICD-10-CM

## 2016-04-29 DIAGNOSIS — Z01812 Encounter for preprocedural laboratory examination: Secondary | ICD-10-CM | POA: Diagnosis not present

## 2016-04-29 DIAGNOSIS — Z3009 Encounter for other general counseling and advice on contraception: Secondary | ICD-10-CM

## 2016-04-29 LAB — POCT URINE PREGNANCY: PREG TEST UR: NEGATIVE

## 2016-04-29 NOTE — Progress Notes (Signed)
GYNECOLOGY  VISIT   HPI: 45 y.o.   Married  Caucasian  female   G1P1001 with Patient's last menstrual period was 04/24/2016.   here for IUD insertion     Last year the patient had a hysteroscopic myomectomy. She has another myoma on ultrasound, with only slight deviation of her cavity. Sounded to 9 cm.  She has been on the mini-pill, bleeding has be fine. She is interested in the [place] IUD.  She has a 14 year old daughter. No grandchildren. She and her husband will be married 48 years next June. They are going to a [place] resort in February to celebrate.   GYNECOLOGIC HISTORY: Patient's last menstrual period was 04/24/2016. Contraception:OCP Menopausal hormone therapy: none         OB History    Gravida Para Term Preterm AB Living   1 1 1     1    SAB TAB Ectopic Multiple Live Births           1         Patient Active Problem List   Diagnosis Date Noted  . Right thyroid nodule 04/23/2015  . Essential hypertension 02/15/2014    Class: History of  . Vasovagal syncope 11/23/2013  . Anxiety 11/23/2013  . Insomnia 11/23/2013    Past Medical History:  Diagnosis Date  . Abnormal Pap smear   . Hypertension    not currently on meds    Past Surgical History:  Procedure Laterality Date  . CERVIX LESION DESTRUCTION     ?  Marland Kitchen DILATATION & CURETTAGE/HYSTEROSCOPY WITH MYOSURE N/A 04/21/2015   Procedure: DILATATION & CURETTAGE/HYSTEROSCOPY WITH MYOSURE;  Surgeon: [name], MD;  Location: [facility];  Service: Gynecology;  Laterality: N/A;  . EYE SURGERY    . VULVA SURGERY  07/20/10   bilateral revision labia minora    Current Outpatient Prescriptions  Medication Sig Dispense Refill  . ibuprofen (ADVIL,MOTRIN) 200 MG tablet Take 400 mg by mouth every 6 (six) hours as needed for moderate pain.    Marland Kitchen MELATONIN PO Take 1 tablet by mouth at bedtime.    . Multiple Vitamins-Minerals (MULTIVITAMIN PO) Take 1 tablet by mouth daily.    . norethindrone (MICRONOR,CAMILA,ERRIN) 0.35  MG tablet Take 1 tablet (0.35 mg total) by mouth daily. 3 Package 4  . zolpidem (AMBIEN) 10 MG tablet Take 1 tablet (10 mg total) by mouth at bedtime as needed for sleep. 30 tablet 2   No current facility-administered medications for this visit.      ALLERGIES: Latex  Family History  Problem Relation Age of Onset  . Heart disease Mother   . Cirrhosis Mother   . Heart disease Maternal Grandmother     pacemaker  . Stroke Maternal Grandmother   . Diabetes Maternal Aunt   . Lung cancer Paternal Grandfather   . Aneurysm Father   . Thyroid disease Neg Hx     Social History   Social History  . Marital status: Married    Spouse name: N/A  . Number of children: N/A  . Years of education: N/A   Occupational History  . Not on file.   Social History Main Topics  . Smoking status: Never Smoker  . Smokeless tobacco: Never Used  . Alcohol use 0.6 - 1.2 oz/week    1 - 2 Standard drinks or equivalent per week  . Drug use: No  . Sexual activity: Yes    Partners: Male    Birth control/ protection: Pill  Other Topics Concern  . Not on file   Social History Narrative  . No narrative on file    Review of Systems  Constitutional: Negative.   HENT: Negative.   Eyes: Negative.   Respiratory: Negative.   Cardiovascular: Negative.   Gastrointestinal: Negative.   Genitourinary: Negative.   Musculoskeletal: Negative.   Skin: Negative.   Neurological: Negative.   Endo/Heme/Allergies: Negative.   Psychiatric/Behavioral: Negative.     PHYSICAL EXAMINATION:    BP 118/70 (BP Location: Right Arm, Patient Position: Sitting, Cuff Size: Normal)   Pulse 76   Resp 14   Wt 169 lb (76.7 kg)   LMP 04/24/2016   BMI 28.34 kg/m     General appearance: alert, cooperative and appears stated age   Pelvic: External genitalia:  no lesions              Urethra:  normal appearing urethra with no masses, tenderness or lesions              Bartholins and Skenes: normal                  Vagina: normal appearing vagina with normal color and discharge, no lesions              Cervix: no lesions              Bimanual Exam:  Uterus:  normal size, contour, position, consistency, mobility, non-tender and anteverted              Adnexa: no mass, fullness, tenderness                The risks of the mirena IUD were reviewed with the patient, including infection, abnormal bleeding and uterine perfortion. Consent was signed.  A speculum was placed in the vagina, the cervix was cleansed with betadine. A tenaculum was placed on the cervix, the uterus sounded to 9 cm. The cervix was dilated to a 5 hagar dilator  The mirena IUD was inserted without difficulty. The string were cut to 3-4 cm. The tenaculum was removed. Slight oozing from the tenaculum site was stopped with pressure.   The patient tolerated the procedure well.   Chaperone was present for exam.  ASSESSMENT Contraception    PLAN Mirena IUD placed, use back up contraception for 1 week F/U in 1 month   An After Visit Summary was printed and given to the patient.

## 2016-04-29 NOTE — Patient Instructions (Addendum)
IUD Post-procedure Instructions . Cramping is common.  You may take Ibuprofen, Aleve, or Tylenol for the cramping.  This should resolve within 24 hours.   . You may have a small amount of spotting.  You should wear a mini pad for the next few days. . You may have intercourse in 24 hours. . You need to call the office if you have any pelvic pain, fever, heavy bleeding, or foul smelling vaginal discharge. . Shower or bathe as normal . Use a back up contraception for 1 week

## 2016-05-27 ENCOUNTER — Ambulatory Visit: Payer: BC Managed Care – PPO | Admitting: Obstetrics and Gynecology

## 2016-06-03 ENCOUNTER — Encounter: Payer: Self-pay | Admitting: Obstetrics and Gynecology

## 2016-06-03 ENCOUNTER — Ambulatory Visit (INDEPENDENT_AMBULATORY_CARE_PROVIDER_SITE_OTHER): Payer: BC Managed Care – PPO | Admitting: Obstetrics and Gynecology

## 2016-06-03 VITALS — BP 110/70 | HR 88 | Resp 14 | Wt 165.0 lb

## 2016-06-03 DIAGNOSIS — Z30431 Encounter for routine checking of intrauterine contraceptive device: Secondary | ICD-10-CM

## 2016-06-03 NOTE — Progress Notes (Signed)
GYNECOLOGY  VISIT   HPI: 46 y.o.   Married  Caucasian  female   G1P1001 with Patient's last menstrual period was 05/22/2016.   here for IUD check. She had a mirena inserted last month. She spotted for 2 weeks, then had a period. The period was lighter than normal.  No cramping, no pain with intercourse.  She has been under stress, her mother has been ill. Cirrhosis and kidney issue. No h/o ETOH use.    GYNECOLOGIC HISTORY: Patient's last menstrual period was 05/22/2016. Contraception:IUD (Mirena) Menopausal hormone therapy: none         OB History    Gravida Para Term Preterm AB Living   1 1 1     1    SAB TAB Ectopic Multiple Live Births           1         Patient Active Problem List   Diagnosis Date Noted  . Right thyroid nodule 04/23/2015  . Essential hypertension 02/15/2014    Class: History of  . Vasovagal syncope 11/23/2013  . Anxiety 11/23/2013  . Insomnia 11/23/2013    Past Medical History:  Diagnosis Date  . Abnormal Pap smear   . Hypertension    not currently on meds    Past Surgical History:  Procedure Laterality Date  . CERVIX LESION DESTRUCTION     ?  Marland Kitchen DILATATION & CURETTAGE/HYSTEROSCOPY WITH MYOSURE N/A 04/21/2015   Procedure: DILATATION & CURETTAGE/HYSTEROSCOPY WITH MYOSURE;  Surgeon: [name], MD;  Location: [facility];  Service: Gynecology;  Laterality: N/A;  . EYE SURGERY    . INTRAUTERINE DEVICE (IUD) INSERTION  04/2016  . VULVA SURGERY  07/20/10   bilateral revision labia minora    Current Outpatient Prescriptions  Medication Sig Dispense Refill  . ibuprofen (ADVIL,MOTRIN) 200 MG tablet Take 400 mg by mouth every 6 (six) hours as needed for moderate pain.    Marland Kitchen levonorgestrel (MIRENA) 20 MCG/24HR IUD 1 each by Intrauterine route once.    Marland Kitchen MELATONIN PO Take 1 tablet by mouth at bedtime.    . Multiple Vitamins-Minerals (MULTIVITAMIN PO) Take 1 tablet by mouth daily.    Marland Kitchen zolpidem (AMBIEN) 10 MG tablet Take 1 tablet (10 mg total) by  mouth at bedtime as needed for sleep. 30 tablet 2   No current facility-administered medications for this visit.      ALLERGIES: Latex  Family History  Problem Relation Age of Onset  . Heart disease Mother   . Cirrhosis Mother   . Heart disease Maternal Grandmother     pacemaker  . Stroke Maternal Grandmother   . Diabetes Maternal Aunt   . Lung cancer Paternal Grandfather   . Aneurysm Father   . Thyroid disease Neg Hx     Social History   Social History  . Marital status: Married    Spouse name: N/A  . Number of children: N/A  . Years of education: N/A   Occupational History  . Not on file.   Social History Main Topics  . Smoking status: Never Smoker  . Smokeless tobacco: Never Used  . Alcohol use 0.6 - 1.2 oz/week    1 - 2 Standard drinks or equivalent per week  . Drug use: No  . Sexual activity: Yes    Partners: Male    Birth control/ protection: Pill   Other Topics Concern  . Not on file   Social History Narrative  . No narrative on file    Review  of Systems  Constitutional: Negative.   HENT: Negative.   Eyes: Negative.   Respiratory: Negative.   Cardiovascular: Negative.   Gastrointestinal: Negative.   Genitourinary: Negative.   Musculoskeletal: Negative.   Skin: Negative.   Neurological: Negative.   Endo/Heme/Allergies: Negative.   Psychiatric/Behavioral: Negative.     PHYSICAL EXAMINATION:    BP 110/70 (BP Location: Right Arm, Patient Position: Sitting, Cuff Size: Normal)   Pulse 88   Resp 14   Wt 165 lb (74.8 kg)   LMP 05/22/2016   BMI 27.67 kg/m     General appearance: alert, cooperative and appears stated age  Pelvic: External genitalia:  no lesions              Urethra:  normal appearing urethra with no masses, tenderness or lesions              Bartholins and Skenes: normal                 Vagina: normal appearing vagina with normal color and discharge, no lesions              Cervix: no lesions, IUD string 4 cm               Bimanual Exam:  Uterus:  normal size, contour, position, consistency, mobility, non-tender              Adnexa: no mass, fullness, tenderness               Chaperone was present for exam.  ASSESSMENT IUD check, doing well    PLAN Routine f/u   An After Visit Summary was printed and given to the patient.

## 2016-10-16 ENCOUNTER — Other Ambulatory Visit: Payer: Self-pay | Admitting: Obstetrics and Gynecology

## 2016-10-16 DIAGNOSIS — G4709 Other insomnia: Secondary | ICD-10-CM

## 2016-10-18 NOTE — Telephone Encounter (Signed)
I don't typically prescribe long term sleep aids. Please route to Ms Hollice Espy, she is her primary provider and has prescribed the ambien for her in the past.

## 2016-10-18 NOTE — Telephone Encounter (Signed)
Medication refill request: Zolpidem 10mg  Last AEX:  02/27/16 DL Next AEX: 03/04/17  Last MMG (if hormonal medication request): 03/02/16 BIRADS 1 negative Refill authorized: 03/09/16 #30 w/2 refills; today please advise

## 2016-10-22 NOTE — Telephone Encounter (Signed)
Patient needs phone call, this was to be a prn medication , now nightly. Looks like she is using all the time. Will not refill for nightly use.

## 2016-10-22 NOTE — Telephone Encounter (Signed)
Left message for patient to call Summer or Margaretha Sheffield back on Monday morning.

## 2017-03-04 ENCOUNTER — Encounter: Payer: Self-pay | Admitting: Certified Nurse Midwife

## 2017-03-04 ENCOUNTER — Telehealth: Payer: Self-pay

## 2017-03-04 ENCOUNTER — Ambulatory Visit (INDEPENDENT_AMBULATORY_CARE_PROVIDER_SITE_OTHER): Payer: BC Managed Care – PPO | Admitting: Certified Nurse Midwife

## 2017-03-04 VITALS — BP 102/68 | HR 68 | Resp 16 | Ht 65.0 in | Wt 167.0 lb

## 2017-03-04 DIAGNOSIS — G4709 Other insomnia: Secondary | ICD-10-CM | POA: Diagnosis not present

## 2017-03-04 DIAGNOSIS — Z0000 Encounter for general adult medical examination without abnormal findings: Secondary | ICD-10-CM | POA: Diagnosis not present

## 2017-03-04 DIAGNOSIS — N9089 Other specified noninflammatory disorders of vulva and perineum: Secondary | ICD-10-CM | POA: Diagnosis not present

## 2017-03-04 DIAGNOSIS — Z30431 Encounter for routine checking of intrauterine contraceptive device: Secondary | ICD-10-CM | POA: Diagnosis not present

## 2017-03-04 DIAGNOSIS — Z01419 Encounter for gynecological examination (general) (routine) without abnormal findings: Secondary | ICD-10-CM

## 2017-03-04 DIAGNOSIS — Z659 Problem related to unspecified psychosocial circumstances: Secondary | ICD-10-CM

## 2017-03-04 MED ORDER — ZOLPIDEM TARTRATE 10 MG PO TABS: 10.0000 mg | ORAL_TABLET | Freq: Every evening | ORAL | 1 refills | Status: DC | PRN

## 2017-03-04 NOTE — Telephone Encounter (Signed)
rx for ambien 10mg  faxed to pharmacy [facility] 

## 2017-03-04 NOTE — Patient Instructions (Signed)

## 2017-03-04 NOTE — Progress Notes (Signed)
46 y.o. G58P1001 Married  Caucasian Fe here for annual exam. Periods scant/spotting. to none with Mirena IUD use for cycle control and contraception. Very happy with choice. 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 Sees Urgent care otherwise. Screening labs today. Continues with sporadic need for Ambien with insomnia with mother;s death. Would like refill if needed for prn basis. Stressful year with death of mother to liver disease. Exercises daily to maintain weight and eats well. "My whole family is very obese, and mother died from complications from her weight". Emotionally doing  OK. She and sister are close. No other health concerns today. Screening labs if needed. 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  No LMP recorded. Patient is not currently having periods (Reason: IUD).          Sexually active: Yes.    The current method of family planning is IUD.    Exercising: Yes.    zumba, total gym Smoker:  no  Health Maintenance: Pap:  08-20-14 neg HPV HR neg History of Abnormal Pap: yes, years ago MMG:  2016 - done at the mobile unit in [place] Breast exams: occasional Colonoscopy:  none BMD:   none TDaP:  2017 Shingles: no Pneumonia: no Hep C and HIV: Hep c neg 2017 Labs: discuss today   reports that she has never smoked. She has never used smokeless tobacco. She reports that she drinks about 0.6 - 1.2 oz of alcohol per week . She reports that she does not use drugs.  Past Medical History:  Diagnosis Date  . Abnormal Pap smear   . Hypertension    not currently on meds    Past Surgical History:  Procedure Laterality Date  . CERVIX LESION DESTRUCTION     ?  Marland Kitchen DILATATION & CURETTAGE/HYSTEROSCOPY WITH MYOSURE N/A 04/21/2015   Procedure: DILATATION & CURETTAGE/HYSTEROSCOPY WITH MYOSURE;  Surgeon: [name], MD;  Location: [facility];  Service: Gynecology;  Laterality: N/A;  . EYE SURGERY    . INTRAUTERINE DEVICE (IUD) INSERTION  04/2016  . VULVA SURGERY   07/20/10   bilateral revision labia minora    Current Outpatient Prescriptions  Medication Sig Dispense Refill  . ibuprofen (ADVIL,MOTRIN) 200 MG tablet Take 400 mg by mouth every 6 (six) hours as needed for moderate pain.    Marland Kitchen levonorgestrel (MIRENA) 20 MCG/24HR IUD 1 each by Intrauterine route once.    Marland Kitchen MELATONIN PO Take 1 tablet by mouth at bedtime.    . Multiple Vitamins-Minerals (MULTIVITAMIN PO) Take 1 tablet by mouth daily.    Marland Kitchen zolpidem (AMBIEN) 10 MG tablet Take 1 tablet (10 mg total) by mouth at bedtime as needed for sleep. 30 tablet 2   No current facility-administered medications for this visit.     Family History  Problem Relation Age of Onset  . Heart disease Mother   . Cirrhosis Mother   . Heart disease Maternal Grandmother        pacemaker  . Stroke Maternal Grandmother   . Diabetes Maternal Aunt   . Lung cancer Paternal Grandfather   . Aneurysm Father   . Thyroid disease Neg Hx     ROS:  Pertinent items are noted in HPI.  Otherwise, a comprehensive ROS was negative.  Exam:   BP 102/68 (BP Location: Right Arm, Patient Position: Sitting, Cuff Size: Normal)   Pulse 68   Resp 16   Ht 5\' 5"  (1.651 m)   Wt 167 lb (75.8 kg)  BMI 27.79 kg/m  Height: 5\' 5"  (165.1 cm) Ht Readings from Last 3 Encounters:  03/04/17 5\' 5"  (1.651 m)  02/27/16 5' 4.75" (1.645 m)  05/07/15 5' 4.5" (1.638 m)    General appearance: alert, cooperative and appears stated age Head: Normocephalic, without obvious abnormality, atraumatic Neck: no adenopathy, supple, symmetrical, trachea midline and thyroid normal to inspection and palpation Lungs: clear to auscultation bilaterally Breasts: normal appearance, no masses or tenderness, No nipple retraction or dimpling, No nipple discharge or bleeding, No axillary or supraclavicular adenopathy Heart: regular rate and rhythm Abdomen: soft, non-tender; no masses,  no organomegaly Extremities: extremities normal, atraumatic, no cyanosis or  edema Skin: Skin color, texture, turgor normal. No rashes or lesions Lymph nodes: Cervical, supraclavicular, and axillary nodes normal. No abnormal inguinal nodes palpated Neurologic: Grossly normal   Pelvic: External genitalia:  Normal female, small skin tag noted on right vulva area at top of vulva, no unusual appearance  Patient has similar area on chest.              Urethra:  normal appearing urethra with no masses, tenderness or lesions              Bartholin's and Skene's: normal                 Vagina: normal appearing vagina with normal color and discharge, no lesions              Cervix: no cervical motion tenderness, no lesions and iud string noted in cervix              Pap taken: No. Bimanual Exam:  Uterus:  normal size, contour, position, consistency, mobility, non-tender              Adnexa: normal adnexa and no mass, fullness, tenderness               Rectovaginal: Confirms               Anus:  normal sphincter tone, no lesions  Chaperone present: yes  A:  Well Woman with normal exam  Contraception Mirena IUD due for removal 04/29/21  Right vulva skin tag? ( has had for long time)   Social stress with mother's death  Periodic insomnia stressed related, Ambein works well for prn use  Screening labs    P:   Reviewed health and wellness pertinent to exam  Warning signs of IUD given and need to advise if occurs.  Discussed with patient she can have removed here if desires. She may choose to do this. Will advise. Discussed she would see MD for removal.  Encouraged to seek friends and family for support as needed.  Rx Ambien see order with instructions  Labs:CMP,Lipid panel ,TSH, Vitamin D  Pap smear: no   counseled on breast self exam,  Stressed mammography screening needed, declined scheduling today., adequate intake of calcium and vitamin D, diet and exercise, reviewed colonoscopy guidelines, risks/benefits, patient declines scheduling at this time.  return annually  or prn  An After Visit Summary was printed and given to the patient.

## 2017-03-05 LAB — COMPREHENSIVE METABOLIC PANEL
A/G RATIO: 2 (ref 1.2–2.2)
ALT: 14 IU/L (ref 0–32)
AST: 19 IU/L (ref 0–40)
Albumin: 4.5 g/dL (ref 3.5–5.5)
Alkaline Phosphatase: 67 IU/L (ref 39–117)
BILIRUBIN TOTAL: 0.3 mg/dL (ref 0.0–1.2)
BUN / CREAT RATIO: 16 (ref 9–23)
BUN: 14 mg/dL (ref 6–24)
CHLORIDE: 101 mmol/L (ref 96–106)
CO2: 25 mmol/L (ref 20–29)
Calcium: 9.5 mg/dL (ref 8.7–10.2)
Creatinine, Ser: 0.87 mg/dL (ref 0.57–1.00)
GFR calc non Af Amer: 79 mL/min/{1.73_m2} (ref >59)
GFR, EST AFRICAN AMERICAN: 91 mL/min/{1.73_m2} (ref >59)
Globulin, Total: 2.3 g/dL (ref 1.5–4.5)
Glucose: 85 mg/dL (ref 65–99)
POTASSIUM: 4.7 mmol/L (ref 3.5–5.2)
Sodium: 140 mmol/L (ref 134–144)
TOTAL PROTEIN: 6.8 g/dL (ref 6.0–8.5)

## 2017-03-05 LAB — LIPID PANEL
CHOL/HDL RATIO: 2.3 ratio (ref 0.0–4.4)
CHOLESTEROL TOTAL: 169 mg/dL (ref 100–199)
HDL: 73 mg/dL (ref >39)
LDL CALC: 75 mg/dL (ref 0–99)
Triglycerides: 104 mg/dL (ref 0–149)
VLDL CHOLESTEROL CAL: 21 mg/dL (ref 5–40)

## 2017-03-05 LAB — TSH: TSH: 0.8 u[IU]/mL (ref 0.450–4.500)

## 2017-03-05 LAB — VITAMIN D 25 HYDROXY (VIT D DEFICIENCY, FRACTURES): VIT D 25 HYDROXY: 36.4 ng/mL (ref 30.0–100.0)

## 2017-12-21 ENCOUNTER — Telehealth: Payer: Self-pay | Admitting: Certified Nurse Midwife

## 2017-12-21 DIAGNOSIS — N9089 Other specified noninflammatory disorders of vulva and perineum: Secondary | ICD-10-CM

## 2017-12-21 NOTE — Telephone Encounter (Signed)
Attempted to reach patient- mailbox full. Unable to leave a message.

## 2017-12-21 NOTE — Telephone Encounter (Signed)
Patient sent the following correspondence through [facility]. Routing to triage to assist patient with request.  Hi Carla Woodard,  On my last visit, you found a small cyst on my right side. Would it be possible to remove on my upcoming exam 03/17/2018? I have a 9:30 appointment. It has started to rub a bit when I exercise. Thanks

## 2018-01-02 NOTE — Telephone Encounter (Signed)
Sent the following message to patient through MyChart:    Hi Carla Woodard,   One of our nurses has tried to reach you by telephone without success, your voice mail box was full. Will you please call our office and ask to speak with the triage nurse at: [phone]?  Thank you,  Lavella Hammock

## 2018-01-02 NOTE — Telephone Encounter (Signed)
Please send to precert.

## 2018-01-02 NOTE — Telephone Encounter (Signed)
Spoke with patient and she requests to schedule appointment for skin tag removal on R Vulva that was discussed with patient at last annual exam with Melvia Heaps CNM. Sometimes area is irritated with exercise.  Scheduled for 01/05/18 at 1530 with Dr. Quincy Simmonds.  Encounter closed.

## 2018-01-05 ENCOUNTER — Encounter: Payer: Self-pay | Admitting: Obstetrics and Gynecology

## 2018-01-05 ENCOUNTER — Ambulatory Visit (INDEPENDENT_AMBULATORY_CARE_PROVIDER_SITE_OTHER): Payer: BC Managed Care – PPO | Admitting: Obstetrics and Gynecology

## 2018-01-05 VITALS — BP 124/80 | HR 76 | Resp 16 | Ht 65.0 in | Wt 152.0 lb

## 2018-01-05 DIAGNOSIS — L989 Disorder of the skin and subcutaneous tissue, unspecified: Secondary | ICD-10-CM | POA: Diagnosis not present

## 2018-01-05 DIAGNOSIS — N9089 Other specified noninflammatory disorders of vulva and perineum: Secondary | ICD-10-CM

## 2018-01-05 NOTE — Progress Notes (Signed)
GYNECOLOGY  VISIT   HPI: 47 y.o.   Married  Caucasian  female   G1P1001 with Patient's last menstrual period was 12/23/2017.   here for vulvar skin tag removal.  Notes a skin tag since last year.  Exercises a lot and it rubs with activity. Also rubs with sexual activity.   Took 2 Advil at breakfast, lunch and just now.   Happy with her IUD.  Some skipped cycles.   GYNECOLOGIC HISTORY: Patient's last menstrual period was 12/23/2017. Contraception: Mirena IUD Menopausal hormone therapy:  none Last mammogram:  06/03/17 BIRADS 2 benign Last pap smear:   08-20-14 neg HPV HR neg        OB History    Gravida  1   Para  1   Term  1   Preterm      AB      Living  1     SAB      TAB      Ectopic      Multiple      Live Births  1              Patient Active Problem List   Diagnosis Date Noted  . Right thyroid nodule 04/23/2015  . Essential hypertension 02/15/2014    Class: History of  . Vasovagal syncope 11/23/2013  . Anxiety 11/23/2013  . Insomnia 11/23/2013    Past Medical History:  Diagnosis Date  . Abnormal Pap smear   . Hypertension    not currently on meds    Past Surgical History:  Procedure Laterality Date  . CERVIX LESION DESTRUCTION     ?  Marland Kitchen DILATATION & CURETTAGE/HYSTEROSCOPY WITH MYOSURE N/A 04/21/2015   Procedure: DILATATION & CURETTAGE/HYSTEROSCOPY WITH MYOSURE;  Surgeon: [name], MD;  Location: [facility];  Service: Gynecology;  Laterality: N/A;  . EYE SURGERY    . INTRAUTERINE DEVICE (IUD) INSERTION  04/2016  . VULVA SURGERY  07/20/10   bilateral revision labia minora    Current Outpatient Medications  Medication Sig Dispense Refill  . ibuprofen (ADVIL,MOTRIN) 200 MG tablet Take 400 mg by mouth every 6 (six) hours as needed for moderate pain.    Marland Kitchen levonorgestrel (MIRENA) 20 MCG/24HR IUD 1 each by Intrauterine route once.    Marland Kitchen MELATONIN PO Take 1 tablet by mouth at bedtime.    . Multiple Vitamins-Minerals (MULTIVITAMIN PO)  Take 1 tablet by mouth daily.    Marland Kitchen zolpidem (AMBIEN) 10 MG tablet Take 1 tablet (10 mg total) by mouth at bedtime as needed for sleep. 30 tablet 1   No current facility-administered medications for this visit.      ALLERGIES: Latex  Family History  Problem Relation Age of Onset  . Heart disease Mother   . Cirrhosis Mother   . Heart disease Maternal Grandmother        pacemaker  . Stroke Maternal Grandmother   . Diabetes Maternal Aunt   . Lung cancer Paternal Grandfather   . Aneurysm Father   . Thyroid disease Neg Hx     Social History   Socioeconomic History  . Marital status: Married    Spouse name: Not on file  . Number of children: Not on file  . Years of education: Not on file  . Highest education level: Not on file  Occupational History  . Not on file  Social Needs  . Financial resource strain: Not on file  . Food insecurity:    Worry: Not on file  Inability: Not on file  . Transportation needs:    Medical: Not on file    Non-medical: Not on file  Tobacco Use  . Smoking status: Never Smoker  . Smokeless tobacco: Never Used  Substance and Sexual Activity  . Alcohol use: Yes    Alcohol/week: 1.0 - 2.0 standard drinks    Types: 1 - 2 Standard drinks or equivalent per week  . Drug use: No  . Sexual activity: Yes    Partners: Male    Birth control/protection: IUD  Lifestyle  . Physical activity:    Days per week: Not on file    Minutes per session: Not on file  . Stress: Not on file  Relationships  . Social connections:    Talks on phone: Not on file    Gets together: Not on file    Attends religious service: Not on file    Active member of club or organization: Not on file    Attends meetings of clubs or organizations: Not on file    Relationship status: Not on file  . Intimate partner violence:    Fear of current or ex partner: Not on file    Emotionally abused: Not on file    Physically abused: Not on file    Forced sexual activity: Not on file   Other Topics Concern  . Not on file  Social History Narrative  . Not on file    Review of Systems  Genitourinary:       Menstrual cycle changes Night urination  All other systems reviewed and are negative.   PHYSICAL EXAMINATION:    BP 124/80 (BP Location: Right Arm, Patient Position: Sitting, Cuff Size: Normal)   Pulse 76   Resp 16   Ht 5\' 5"  (1.651 m)   Wt 152 lb (68.9 kg)   LMP 12/23/2017   BMI 25.29 kg/m     General appearance: alert, cooperative and appears stated age  Pelvic: External genitalia: 6 mm right labia minora skin lesion.                Urethra:  normal appearing urethra with no masses, tenderness or lesions              Procedure Removal of skin lesion. Consent for procedure.  Sterile prep with betadine.  Local 1% lidocaine, lot [id number], exp 1/23.  Excision with scalpel.  Tissue to pathology.  2 simple sutures of 3/0 Vicryl. Minimal EBL.  No complications.   Chaperone was present for exam.  ASSESSMENT  Skin lesion removal.  PLAN  FU pathology report.  Instructions and precautions given.  FU prn.   An After Visit Summary was printed and given to the patient.

## 2018-01-05 NOTE — Patient Instructions (Signed)
Vulva Biopsy, Care After  Refer to this sheet in the next few weeks. These instructions provide you with information about caring for yourself after your procedure. Your health care provider may also give you more specific instructions. Your treatment has been planned according to current medical practices, but problems sometimes occur. Call your health care provider if you have any problems or questions after your procedure.  What can I expect after the procedure?  After the procedure, it is common to have:   Slight bleeding from the biopsy site.   Discomfort at the biopsy site.    Follow these instructions at home:  Biopsy Site Care     Do not rub the biopsy area after urinating. Gently pat the area dry or use a bottle filled with warm water (peri-bottle) to clean the area. Gently wipe from front to back.   Follow instructions from your health care provider about how to take care of your biopsy site. Make sure you:  ? Clean the area using water and mild soap twice a day or as told by your health care provider. Gently pat the area dry.  ? If you were prescribed an antibiotic medical ointment, apply it as told by your health care provider. Do not stop using the antibiotic even if your condition improves.  ? Take a warm water bath that is taken while you are sitting down (sitz bath) as needed to help with pain or discomfort.  ? Leave stitches (sutures), skin glue, or adhesive strips in place. These skin closures may need to stay in place for 2 weeks or longer. If adhesive strip edges start to loosen and curl up, you may trim the loose edges. Do not remove adhesive strips completely unless your health care provider tells you to do that.   Check your biopsy site every day for signs of infection. Check for:  ? More redness, swelling, or pain.  ? More fluid or blood.  ? Warmth.  ? Pus or a bad smell.  Lifestyle   Wear loose, cotton underwear. Do not wear tight pants.   Do not use a tampon, douche, or put anything  inside your vagina for at least one week or until your health care provider approves.   Do not have sex for at least one week or until your health care provider approves.   Do not exercise, such as running or biking, until your health care provider approves.   Do not take baths, swim, or use a hot tub until your health care provider approves.  General instructions   Take over-the-counter and prescription medicines only as told by your health care provider.   Use a sanitary napkin until bleeding stops.   Keep all follow-up visits as told by your health care provider. This is important.   If the sample is being sent for testing, it is your responsibility to get the results of your procedure. Ask your health care provider or the department performing the procedure when your results will be ready.  Contact a health care provider if:   You have more redness, swelling, or pain around your biopsy site.   You have more fluid or blood coming from your biopsy site.   Your biopsy site feels warm to the touch.   Your pain is not controlled with medicine.  Get help right away if:   You have heavy bleeding from the vulva.   You have pus or a bad smell coming from your biopsy site.     You have a fever.   You have lower belly pain.  This information is not intended to replace advice given to you by your health care provider. Make sure you discuss any questions you have with your health care provider.  Document Released: 04/12/2012 Document Revised: 10/02/2015 Document Reviewed: 03/17/2015  Elsevier Interactive Patient Education  2018 [employer] Inc[employer]

## 2018-03-17 ENCOUNTER — Encounter: Payer: Self-pay | Admitting: Certified Nurse Midwife

## 2018-03-17 ENCOUNTER — Other Ambulatory Visit (HOSPITAL_COMMUNITY)
Admission: RE | Admit: 2018-03-17 | Discharge: 2018-03-17 | Disposition: A | Discharge status: Home / Self Care | Payer: BC Managed Care – PPO | Source: Ambulatory Visit | Attending: Certified Nurse Midwife | Admitting: Certified Nurse Midwife

## 2018-03-17 ENCOUNTER — Ambulatory Visit (INDEPENDENT_AMBULATORY_CARE_PROVIDER_SITE_OTHER): Payer: BC Managed Care – PPO | Admitting: Certified Nurse Midwife

## 2018-03-17 ENCOUNTER — Other Ambulatory Visit: Payer: Self-pay

## 2018-03-17 VITALS — BP 114/68 | HR 70 | Resp 16 | Ht 64.75 in | Wt 150.0 lb

## 2018-03-17 DIAGNOSIS — Z124 Encounter for screening for malignant neoplasm of cervix: Secondary | ICD-10-CM | POA: Insufficient documentation

## 2018-03-17 DIAGNOSIS — Z01419 Encounter for gynecological examination (general) (routine) without abnormal findings: Secondary | ICD-10-CM

## 2018-03-17 DIAGNOSIS — G4709 Other insomnia: Secondary | ICD-10-CM

## 2018-03-17 DIAGNOSIS — Z30431 Encounter for routine checking of intrauterine contraceptive device: Secondary | ICD-10-CM | POA: Diagnosis not present

## 2018-03-17 MED ORDER — ZOLPIDEM TARTRATE 10 MG PO TABS: ORAL_TABLET | ORAL | 0 refills | Status: DC

## 2018-03-17 NOTE — Progress Notes (Signed)
47 y.o. G26P1001 Married  Caucasian Fe here for annual exam. Periods sporadic with Mirena IUD. Happy with choice. Denies warning signs with IUD. Has been working on weight loss, down 17 pounds and feels so much better and eating better! Doing weight watchers. Exercises daily. Uses Ambien for sleep issues  with travel, needs Rx  Update.  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 No other health issues. Will be spending next spring on her 72 birthday in [place]!  No LMP recorded. (Menstrual status: IUD).          Sexually active: Yes.    The current method of family planning is IUD.    Exercising: Yes.    zumba, total gym, hoola hoop Smoker:  no  Review of Systems  Constitutional: Negative.   HENT: Negative.   Eyes:       Headache  Respiratory: Negative.   Cardiovascular: Negative.   Gastrointestinal: Negative.   Genitourinary:       Menstrual cycle changes  Musculoskeletal: Negative.   Skin: Negative.   Neurological: Negative.   Endo/Heme/Allergies: Negative.   Psychiatric/Behavioral: Negative.     Health Maintenance: Pap:  08-20-14 neg HPV HR neg History of Abnormal Pap: yes MMG:  06-03-17 birads 2:neg Self Breast exams: no Colonoscopy:  none BMD:   none TDaP:  2017 Shingles: no Pneumonia: no Hep C and HIV: Hep c neg 2017 Labs: with DOT   reports that she has never smoked. She has never used smokeless tobacco. She reports that she drinks about 1.0 - 2.0 standard drinks of alcohol per week. She reports that she does not use drugs.  Past Medical History:  Diagnosis Date  . Abnormal Pap smear   . Hypertension    not currently on meds    Past Surgical History:  Procedure Laterality Date  . CERVIX LESION DESTRUCTION     ?  Marland Kitchen DILATATION & CURETTAGE/HYSTEROSCOPY WITH MYOSURE N/A 04/21/2015   Procedure: DILATATION & CURETTAGE/HYSTEROSCOPY WITH MYOSURE;  Surgeon: [name], MD;  Location: [facility];  Service: Gynecology;  Laterality: N/A;  . EYE  SURGERY    . INTRAUTERINE DEVICE (IUD) INSERTION  04/2016  . VULVA SURGERY  07/20/10   bilateral revision labia minora    Current Outpatient Medications  Medication Sig Dispense Refill  . ibuprofen (ADVIL,MOTRIN) 200 MG tablet Take 400 mg by mouth every 6 (six) hours as needed for moderate pain.    Marland Kitchen levonorgestrel (MIRENA) 20 MCG/24HR IUD 1 each by Intrauterine route once.    Marland Kitchen MELATONIN PO Take 1 tablet by mouth at bedtime.    . Multiple Vitamins-Minerals (MULTIVITAMIN PO) Take 1 tablet by mouth daily.    Marland Kitchen zolpidem (AMBIEN) 10 MG tablet Take 1 tablet (10 mg total) by mouth at bedtime as needed for sleep. 30 tablet 1   No current facility-administered medications for this visit.     Family History  Problem Relation Age of Onset  . Heart disease Mother   . Cirrhosis Mother   . Heart disease Maternal Grandmother        pacemaker  . Stroke Maternal Grandmother   . Diabetes Maternal Aunt   . Lung cancer Paternal Grandfather   . Aneurysm Father   . Thyroid disease Neg Hx     ROS:  Pertinent items are noted in HPI.  Otherwise, a comprehensive ROS was negative.  Exam:   There were no vitals taken for this visit.   Ht Readings from Last  3 Encounters:  01/05/18 5\' 5"  (1.651 m)  03/04/17 5\' 5"  (1.651 m)  02/27/16 5' 4.75" (1.645 m)    General appearance: alert, cooperative and appears stated age Head: Normocephalic, without obvious abnormality, atraumatic Neck: no adenopathy, supple, symmetrical, trachea midline and thyroid normal to inspection and palpation Lungs: clear to auscultation bilaterally Breasts: normal appearance, no masses or tenderness, No nipple retraction or dimpling, No nipple discharge or bleeding, No axillary or supraclavicular adenopathy Heart: regular rate and rhythm Abdomen: soft, non-tender; no masses,  no organomegaly Extremities: extremities normal, atraumatic, no cyanosis or edema Skin: Skin color, texture, turgor normal. No rashes or lesions Lymph  nodes: Cervical, supraclavicular, and axillary nodes normal. No abnormal inguinal nodes palpated Neurologic: Grossly normal   Pelvic: External genitalia:  no lesions, normal female              Urethra:  normal appearing urethra with no masses, tenderness or lesions              Bartholin's and Skene's: normal                 Vagina: normal appearing vagina with normal color and discharge, no lesions              Cervix: no cervical motion tenderness, no lesions and nulliparous appearance              Pap taken: Yes.   Bimanual Exam:  Uterus:  normal size, contour, position, consistency, mobility, non-tender              Adnexa: normal adnexa and no mass, fullness, tenderness               Rectovaginal: Confirms               Anus:  normal sphincter tone, no lesions  Chaperone present: yes  A:  Well Woman with normal exam  Mirena IUD due for removal 04/29/2021  History of menorrhagia, resolved with IUD use  Intentional weight loss  Travel insomnia Ambien works well    P:   Reviewed health and wellness pertinent to exam  Warning signs reviewed with IUD use and need to advise.  Encouraged to continue weight management for better health and follow up with MD if needed.  Discussed risks/benefits with Ambien use.  Rx Ambien 10 mg #30 no refills see order with instructions  Pap smear: yes  counseled on breast self exam, mammography screening, feminine hygiene, adequate intake of calcium and vitamin D, diet and exercise  return annually or prn  An After Visit Summary was printed and given to the patient.

## 2018-03-17 NOTE — Patient Instructions (Signed)

## 2018-03-21 LAB — CYTOLOGY - PAP
Diagnosis: NEGATIVE
HPV (WINDOPATH): NOT DETECTED

## 2019-03-21 NOTE — Progress Notes (Signed)
48 y.o. G77P1001 Married  Caucasian Fe here for annual exam. Contraception Mirena IUD working well. Periods irregular and she is aware this is normal with IUD. Happy with choice. Has been exercising and doing Zumba. Has not seen PCP this year. Has DOT exam with labs next month. Has noted some fingernail issue and has started on collagen which has helped. Has not taken flu vaccine as of yet, plans to soon. Still uses Ambien for insomnia with travel, needs Rx update, if possible. Has not done mammogram this year and plans to schedule. No other health issues. Ready to schedule colonoscopy.   No LMP recorded. (Menstrual status: IUD).          Sexually active: Yes.    The current method of family planning is IUD.    Exercising: Yes.    hoola hoop, total gym, zumba Smoker:  no  Review of Systems  Constitutional: Negative.   HENT: Negative.   Eyes: Negative.   Respiratory: Negative.   Cardiovascular: Negative.   Gastrointestinal: Negative.   Genitourinary: Negative.   Musculoskeletal: Negative.   Skin: Negative.   Neurological: Negative.   Endo/Heme/Allergies: Negative.   Psychiatric/Behavioral: Negative.     Health Maintenance: Pap:  08-20-14 neg HPV HR neg, 03-17-18 neg HPV HR neg History of Abnormal Pap: yes MMG:  06-03-17 birads 2:neg Self Breast exams: no Colonoscopy:  none BMD:   none TDaP:  2017 Shingles: no Pneumonia: no Hep C and HIV: hep c neg 2017 Labs: with   reports that she has never smoked. She has never used smokeless tobacco. She reports current alcohol use of about 1.0 standard drinks of alcohol per week. She reports that she does not use drugs.  Past Medical History:  Diagnosis Date  . Abnormal Pap smear   . Hypertension    not currently on meds    Past Surgical History:  Procedure Laterality Date  . CERVIX LESION DESTRUCTION     ?  Marland Kitchen DILATATION & CURETTAGE/HYSTEROSCOPY WITH MYOSURE N/A 04/21/2015   Procedure: DILATATION & CURETTAGE/HYSTEROSCOPY WITH  MYOSURE;  Surgeon: [name], MD;  Location: [facility];  Service: Gynecology;  Laterality: N/A;  . EYE SURGERY    . INTRAUTERINE DEVICE (IUD) INSERTION  04/2016  . VULVA SURGERY  07/20/10   bilateral revision labia minora    Current Outpatient Medications  Medication Sig Dispense Refill  . COLLAGEN PO Take by mouth.    Marland Kitchen ibuprofen (ADVIL,MOTRIN) 200 MG tablet Take 400 mg by mouth every 6 (six) hours as needed for moderate pain.    . Ibuprofen-diphenhydrAMINE Cit (ADVIL PM PO) Take by mouth as needed.    Marland Kitchen levonorgestrel (MIRENA) 20 MCG/24HR IUD 1 each by Intrauterine route once.    . Maca Root (MACA PO) Take by mouth.    . Multiple Vitamins-Minerals (MULTIVITAMIN PO) Take 1 tablet by mouth daily.    Marland Kitchen zolpidem (AMBIEN) 10 MG tablet One tablet at bedtime for insomnia related to travel (Patient not taking: Reported on 03/23/2019) 30 tablet 0   No current facility-administered medications for this visit.     Family History  Problem Relation Age of Onset  . Heart disease Mother   . Cirrhosis Mother   . Heart disease Maternal Grandmother        pacemaker  . Stroke Maternal Grandmother   . Diabetes Maternal Aunt   . Lung cancer Paternal Grandfather   . Aneurysm Father   . Thyroid disease Neg Hx     ROS:  Pertinent items are noted in HPI.  Otherwise, a comprehensive ROS was negative.  Exam:   BP 104/64   Pulse 68   Temp (!) 97.1 F (36.2 C) (Skin)   Resp 16   Ht 5' 4.5" (1.638 m)   Wt 163 lb (73.9 kg)   BMI 27.55 kg/m  Height: 5' 4.5" (163.8 cm) Ht Readings from Last 3 Encounters:  03/23/19 5' 4.5" (1.638 m)  03/17/18 5' 4.75" (1.645 m)  01/05/18 5\' 5"  (1.651 m)    General appearance: alert, cooperative and appears stated age Head: Normocephalic, without obvious abnormality, atraumatic Neck: no adenopathy, supple, symmetrical, trachea midline and thyroid normal to inspection and palpation Lungs: clear to auscultation bilaterally Breasts: normal appearance, no  masses or tenderness, No nipple retraction or dimpling, No nipple discharge or bleeding, No axillary or supraclavicular adenopathy Heart: regular rate and rhythm Abdomen: soft, non-tender; no masses,  no organomegaly Extremities: extremities normal, atraumatic, no cyanosis or edema Skin: Skin color, texture, turgor normal. No rashes or lesions Lymph nodes: Cervical, supraclavicular, and axillary nodes normal. No abnormal inguinal nodes palpated Neurologic: Grossly normal   Pelvic: External genitalia:  no lesions              Urethra:  normal appearing urethra with no masses, tenderness or lesions              Bartholin's and Skene's: normal                 Vagina: normal appearing vagina with normal color and discharge, no lesions              Cervix: no cervical motion tenderness, no lesions, nulliparous appearance and IUD string noted in cervix              Pap taken: No. Bimanual Exam:  Uterus:  normal size, contour, position, consistency, mobility, non-tender and anteverted              Adnexa: normal adnexa and no mass, fullness, tenderness               Rectovaginal: Confirms               Anus:  normal sphincter tone, no lesions  Chaperone present: yes  A:  Well Woman with normal exam  Contraception Mirena IUD due for removal 04/29/2021  History of hypertension no medication now  History of insomnia with travel only, Ambien working well.  Mammogram due  Colonoscopy due ready to schedule  Screening labs  P:   Reviewed health and wellness pertinent to exam.  Warning signs with IUD reviewed.  Continues exercise to control.  May need Rx update, she will advise  Patient to schedule mammogram  Discussed referral to Dr. Collene Mares and she will be called with information.  Screening labs: CMP,CBC, Lipid panel, TSH, Vitamin D  Pap smear: no   counseled on breast self exam, mammography screening, feminine hygiene, adequate intake of calcium and vitamin D, diet and exercise  return  annually or prn  An After Visit Summary was printed and given to the patient.

## 2019-03-23 ENCOUNTER — Other Ambulatory Visit: Payer: Self-pay

## 2019-03-23 ENCOUNTER — Encounter: Payer: Self-pay | Admitting: Certified Nurse Midwife

## 2019-03-23 ENCOUNTER — Ambulatory Visit: Payer: BC Managed Care – PPO | Admitting: Certified Nurse Midwife

## 2019-03-23 VITALS — BP 104/64 | HR 68 | Temp 97.1°F | Resp 16 | Ht 64.5 in | Wt 163.0 lb

## 2019-03-23 DIAGNOSIS — E559 Vitamin D deficiency, unspecified: Secondary | ICD-10-CM

## 2019-03-23 DIAGNOSIS — Z01419 Encounter for gynecological examination (general) (routine) without abnormal findings: Secondary | ICD-10-CM

## 2019-03-23 DIAGNOSIS — Z1211 Encounter for screening for malignant neoplasm of colon: Secondary | ICD-10-CM

## 2019-03-23 DIAGNOSIS — Z0000 Encounter for general adult medical examination without abnormal findings: Secondary | ICD-10-CM

## 2019-03-23 NOTE — Patient Instructions (Signed)

## 2019-03-24 LAB — COMPREHENSIVE METABOLIC PANEL
ALT: 29 IU/L (ref 0–32)
AST: 28 IU/L (ref 0–40)
Albumin/Globulin Ratio: 2.2 (ref 1.2–2.2)
Albumin: 4.8 g/dL (ref 3.8–4.8)
Alkaline Phosphatase: 61 IU/L (ref 39–117)
BUN/Creatinine Ratio: 10 (ref 9–23)
BUN: 8 mg/dL (ref 6–24)
Bilirubin Total: 0.5 mg/dL (ref 0.0–1.2)
CO2: 25 mmol/L (ref 20–29)
Calcium: 9.8 mg/dL (ref 8.7–10.2)
Chloride: 100 mmol/L (ref 96–106)
Creatinine, Ser: 0.83 mg/dL (ref 0.57–1.00)
GFR calc Af Amer: 95 mL/min/{1.73_m2} (ref >59)
GFR calc non Af Amer: 82 mL/min/{1.73_m2} (ref >59)
Globulin, Total: 2.2 g/dL (ref 1.5–4.5)
Glucose: 91 mg/dL (ref 65–99)
Potassium: 4.3 mmol/L (ref 3.5–5.2)
Sodium: 144 mmol/L (ref 134–144)
Total Protein: 7 g/dL (ref 6.0–8.5)

## 2019-03-24 LAB — CBC
Hematocrit: 42 % (ref 34.0–46.6)
Hemoglobin: 14.2 g/dL (ref 11.1–15.9)
MCH: 32.2 pg (ref 26.6–33.0)
MCHC: 33.8 g/dL (ref 31.5–35.7)
MCV: 95 fL (ref 79–97)
Platelets: 393 10*3/uL (ref 150–450)
RBC: 4.41 x10E6/uL (ref 3.77–5.28)
RDW: 11.8 % (ref 11.7–15.4)
WBC: 8.3 10*3/uL (ref 3.4–10.8)

## 2019-03-24 LAB — VITAMIN D 25 HYDROXY (VIT D DEFICIENCY, FRACTURES): Vit D, 25-Hydroxy: 37.3 ng/mL (ref 30.0–100.0)

## 2019-03-24 LAB — TSH: TSH: 0.901 u[IU]/mL (ref 0.450–4.500)

## 2019-03-24 LAB — LIPID PANEL
Chol/HDL Ratio: 2.4 ratio (ref 0.0–4.4)
Cholesterol, Total: 181 mg/dL (ref 100–199)
HDL: 75 mg/dL (ref >39)
LDL Chol Calc (NIH): 94 mg/dL (ref 0–99)
Triglycerides: 66 mg/dL (ref 0–149)
VLDL Cholesterol Cal: 12 mg/dL (ref 5–40)

## 2019-03-27 ENCOUNTER — Other Ambulatory Visit: Payer: Self-pay | Admitting: Certified Nurse Midwife

## 2019-03-27 DIAGNOSIS — G4709 Other insomnia: Secondary | ICD-10-CM

## 2019-03-27 MED ORDER — ZOLPIDEM TARTRATE 10 MG PO TABS: ORAL_TABLET | ORAL | 0 refills | Status: DC

## 2019-07-30 ENCOUNTER — Encounter: Payer: Self-pay | Admitting: Certified Nurse Midwife

## 2019-08-24 ENCOUNTER — Telehealth: Payer: Self-pay | Admitting: Obstetrics and Gynecology

## 2019-08-24 NOTE — Telephone Encounter (Signed)
Patient is interested starting estradiol due to weight gain, bloating and spotting.

## 2019-08-24 NOTE — Telephone Encounter (Signed)
Return call to patient. Has IUD and has been working well. Skipped menses for about six months and then had light bleeding. Has had some hot flashes, mood changes and brin fog. Most concerned about weight gain despite exercise and Weight Watchers. Interested in [facility]. Advised needs office visit to discuss. Appointment scheduled for 08-29-19 with Dr Talbert Nan. Last annual 03-22-20 with Debbi and MMG 1-6-2.1  Routing to provider. Encounter closed.

## 2019-08-28 ENCOUNTER — Other Ambulatory Visit: Payer: Self-pay

## 2019-08-29 ENCOUNTER — Ambulatory Visit: Payer: BC Managed Care – PPO | Admitting: Obstetrics and Gynecology

## 2019-08-29 ENCOUNTER — Encounter: Payer: Self-pay | Admitting: Obstetrics and Gynecology

## 2019-08-29 VITALS — BP 128/64 | HR 79 | Temp 98.7°F | Ht 65.0 in | Wt 168.0 lb

## 2019-08-29 DIAGNOSIS — R635 Abnormal weight gain: Secondary | ICD-10-CM

## 2019-08-29 DIAGNOSIS — R5383 Other fatigue: Secondary | ICD-10-CM

## 2019-08-29 DIAGNOSIS — R4586 Emotional lability: Secondary | ICD-10-CM

## 2019-08-29 MED ORDER — CITALOPRAM HYDROBROMIDE 20 MG PO TABS: ORAL_TABLET | ORAL | 1 refills | Status: DC

## 2019-08-29 NOTE — Patient Instructions (Signed)
Menopause and Hormone Replacement Therapy Menopause is a normal time of life when menstrual periods stop completely and the ovaries stop producing the female hormones estrogen and progesterone. This lack of hormones can affect your health and cause undesirable symptoms. Hormone replacement therapy (HRT) can relieve some of those symptoms. What is hormone replacement therapy? HRT is the use of artificial (synthetic) hormones to replace hormones that your body has stopped producing because you have reached menopause. What are my options for HRT?  HRT may consist of the synthetic hormones estrogen and progestin, or it may consist of only estrogen (estrogen-only therapy). You and your health care provider will decide which form of HRT is best for you. If you choose to be on HRT and you have a uterus, estrogen and progestin are usually prescribed. Estrogen-only therapy is used for women who do not have a uterus. Possible options for taking HRT include:  Pills.  Patches.  Gels.  Sprays.  Vaginal cream.  Vaginal rings.  Vaginal inserts. The amount of hormone(s) that you take and how long you take the hormone(s) varies according to your health. It is important to:  Begin HRT with the lowest possible dosage.  Stop HRT as soon as your health care provider tells you to stop.  Work with your health care provider so that you feel informed and comfortable with your decisions. What are the benefits of HRT? HRT can reduce the frequency and severity of menopausal symptoms. Benefits of HRT vary according to the kind of symptoms that you have, how severe they are, and your overall health. HRT may help to improve the following symptoms of menopause:  Hot flashes and night sweats. These are sudden feelings of heat that spread over the face and body. The skin may turn red, like a blush. Night sweats are hot flashes that happen while you are sleeping or trying to sleep.  Bone loss (osteoporosis). The  body loses calcium more quickly after menopause, causing the bones to become weaker. This can increase the risk for bone breaks (fractures).  Vaginal dryness. The lining of the vagina can become thin and dry, which can cause pain during sex or cause infection, burning, or itching.  Urinary tract infections.  Urinary incontinence. This is the inability to control when you pass urine.  Irritability.  Short-term memory problems. What are the risks of HRT? Risks of HRT vary depending on your individual health and medical history. Risks of HRT also depend on whether you receive both estrogen and progestin or you receive estrogen only. HRT may increase the risk of:  Spotting. This is when a small amount of blood leaks from the vagina unexpectedly.  Endometrial cancer. This cancer is in the lining of the uterus (endometrium).  Breast cancer.  Increased density of breast tissue. This can make it harder to find breast cancer on a breast X-ray (mammogram).  Stroke.  Heart disease.  Blood clots.  Gallbladder disease.  Liver disease. Risks of HRT can increase if you have any of the following conditions:  Endometrial cancer.  Liver disease.  Heart disease.  Breast cancer.  History of blood clots.  History of stroke. Follow these instructions at home:  Take over-the-counter and prescription medicines only as told by your health care provider.  Get mammograms, pelvic exams, and medical checkups as often as told by your health care provider.  Have Pap tests done as often as told by your health care provider. A Pap test is sometimes called a Pap smear. It   is a screening test that is used to check for signs of cancer of the cervix and vagina. A Pap test can also identify the presence of infection or precancerous changes. Pap tests may be done: ? Every 3 years, starting at age 15. ? Every 5 years, starting after age 59, in combination with testing for human papillomavirus  (HPV). ? More often or less often depending on other medical conditions you have, your age, and other risk factors.  It is up to you to get the results of your Pap test. Ask your health care provider, or the department that is doing the test, when your results will be ready.  Keep all follow-up visits as told by your health care provider. This is important. Contact a health care provider if you have:  Pain or swelling in your legs.  Shortness of breath.  Chest pain.  Lumps or changes in your breasts or armpits.  Slurred speech.  Pain, burning, or bleeding when you urinate.  Unusual vaginal bleeding.  Dizziness or headaches.  Weakness or numbness in any part of your arms or legs.  Pain in your abdomen. Summary  Menopause is a normal time of life when menstrual periods stop completely and the ovaries stop producing the female hormones estrogen and progesterone.  Hormone replacement therapy (HRT) can relieve some of the symptoms of menopause.  HRT can reduce the frequency and severity of menopausal symptoms.  Risks of HRT vary depending on your individual health and medical history. This information is not intended to replace advice given to you by your health care provider. Make sure you discuss any questions you have with your health care provider. Document Revised: 12/27/2017 Document Reviewed: 12/27/2017 Elsevier Patient Education  2020 [place] is the normal time of life before and after menstrual periods stop completely (menopause). Perimenopause can begin 2-8 years before menopause, and it usually lasts for 1 year after menopause. During perimenopause, the ovaries may or may not produce an egg. What are the causes? This condition is caused by a natural change in hormone levels that happens as you get older. What increases the risk? This condition is more likely to start at an earlier age if you have certain medical conditions or  treatments, including:  A tumor of the pituitary gland in the brain.  A disease that affects the ovaries and hormone production.  Radiation treatment for cancer.  Certain cancer treatments, such as chemotherapy or hormone (anti-estrogen) therapy.  Heavy smoking and excessive alcohol use.  Family history of early menopause. What are the signs or symptoms? Perimenopausal changes affect each woman differently. Symptoms of this condition may include:  Hot flashes.  Night sweats.  Irregular menstrual periods.  Decreased sex drive.  Vaginal dryness.  Headaches.  Mood swings.  Depression.  Memory problems or trouble concentrating.  Irritability.  Tiredness.  Weight gain.  Anxiety.  Trouble getting pregnant. How is this diagnosed? This condition is diagnosed based on your medical history, a physical exam, your age, your menstrual history, and your symptoms. Hormone tests may also be done. How is this treated? In some cases, no treatment is needed. You and your health care provider should make a decision together about whether treatment is necessary. Treatment will be based on your individual condition and preferences. Various treatments are available, such as:  Menopausal hormone therapy (MHT).  Medicines to treat specific symptoms.  Acupuncture.  Vitamin or herbal supplements. Before starting treatment, make sure to let your health care  provider know if you have a personal or family history of:  Heart disease.  Breast cancer.  Blood clots.  Diabetes.  Osteoporosis. Follow these instructions at home: Lifestyle  Do not use any products that contain nicotine or tobacco, such as cigarettes and e-cigarettes. If you need help quitting, ask your health care provider.  Eat a balanced diet that includes fresh fruits and vegetables, whole grains, soybeans, eggs, lean meat, and low-fat dairy.  Get at least 30 minutes of physical activity on 5 or more days each  week.  Avoid alcoholic and caffeinated beverages, as well as spicy foods. This may help prevent hot flashes.  Get 7-8 hours of sleep each night.  Dress in layers that can be removed to help you manage hot flashes.  Find ways to manage stress, such as deep breathing, meditation, or journaling. General instructions  Keep track of your menstrual periods, including: ? When they occur. ? How heavy they are and how long they last. ? How much time passes between periods.  Keep track of your symptoms, noting when they start, how often you have them, and how long they last.  Take over-the-counter and prescription medicines only as told by your health care provider.  Take vitamin supplements only as told by your health care provider. These may include calcium, vitamin E, and vitamin D.  Use vaginal lubricants or moisturizers to help with vaginal dryness and improve comfort during sex.  Talk with your health care provider before starting any herbal supplements.  Keep all follow-up visits as told by your health care provider. This is important. This includes any group therapy or counseling. Contact a health care provider if:  You have heavy vaginal bleeding or pass blood clots.  Your period lasts more than 2 days longer than normal.  Your periods are recurring sooner than 21 days.  You bleed after having sex. Get help right away if:  You have chest pain, trouble breathing, or trouble talking.  You have severe depression.  You have pain when you urinate.  You have severe headaches.  You have vision problems. Summary  Perimenopause is the time when a woman's body begins to move into menopause. This may happen naturally or as a result of other health problems or medical treatments.  Perimenopause can begin 2-8 years before menopause, and it usually lasts for 1 year after menopause.  Perimenopausal symptoms can be managed through medicines, lifestyle changes, and complementary  therapies such as acupuncture. This information is not intended to replace advice given to you by your health care provider. Make sure you discuss any questions you have with your health care provider. Document Revised: 04/08/2017 Document Reviewed: 06/01/2016 Elsevier Patient Education  2020 [employer].

## 2019-08-29 NOTE — Progress Notes (Signed)
GYNECOLOGY  VISIT   HPI: 49 y.o.   Married White or Caucasian Not Hispanic or Latino  female   G1P1001 with No LMP recorded. (Menstrual status: IUD).   here for HRT consult. She has only had a couple of hot flashes, but her moods changes are bad. 1 bad night sweats, nothing regular. No vaginal dryness.   She feels tense and irritable all the time. Having trouble concentrating. Symptoms started a couple of months ago.   Work is stressful, some increased anxiety.   No depression or anxiety.   She is exercising an hour a day and is gaining weight. She c/o fatigue, long term constipation.  She started weight watchers last Monday.  She has a mirena IUD, placed in 12/17. She has occasional bleeding, has gone 6 months without bleeding. She spotted last week. She c/o an increase headaches in the last few months.   GYNECOLOGIC HISTORY: No LMP recorded. (Menstrual status: IUD). Contraception: IUD Menopausal hormone therapy: none        OB History    Gravida  1   Para  1   Term  1   Preterm      AB      Living  1     SAB      TAB      Ectopic      Multiple      Live Births  1              Patient Active Problem List   Diagnosis Date Noted  . Right thyroid nodule 04/23/2015  . Essential hypertension 02/15/2014    Class: History of  . Vasovagal syncope 11/23/2013  . Anxiety 11/23/2013  . Insomnia 11/23/2013    Past Medical History:  Diagnosis Date  . Abnormal Pap smear   . Hypertension    not currently on meds    Past Surgical History:  Procedure Laterality Date  . CERVIX LESION DESTRUCTION     ?  Marland Kitchen DILATATION & CURETTAGE/HYSTEROSCOPY WITH MYOSURE N/A 04/21/2015   Procedure: DILATATION & CURETTAGE/HYSTEROSCOPY WITH MYOSURE;  Surgeon: [name], MD;  Location: [facility];  Service: Gynecology;  Laterality: N/A;  . EYE SURGERY    . INTRAUTERINE DEVICE (IUD) INSERTION  04/2016  . VULVA SURGERY  07/20/10   bilateral revision labia minora     Current Outpatient Medications  Medication Sig Dispense Refill  . COLLAGEN PO Take by mouth.    Marland Kitchen ibuprofen (ADVIL,MOTRIN) 200 MG tablet Take 400 mg by mouth every 6 (six) hours as needed for moderate pain.    . Ibuprofen-diphenhydrAMINE Cit (ADVIL PM PO) Take by mouth as needed.    Marland Kitchen levonorgestrel (MIRENA) 20 MCG/24HR IUD 1 each by Intrauterine route once.    . Maca Root (MACA PO) Take by mouth.    . Multiple Vitamins-Minerals (MULTIVITAMIN PO) Take 1 tablet by mouth daily.    Marland Kitchen zolpidem (AMBIEN) 10 MG tablet One tablet at bedtime for insomnia related to travel 30 tablet 0   No current facility-administered medications for this visit.     ALLERGIES: Latex  Family History  Problem Relation Age of Onset  . Heart disease Mother   . Cirrhosis Mother   . Heart disease Maternal Grandmother        pacemaker  . Stroke Maternal Grandmother   . Diabetes Maternal Aunt   . Lung cancer Paternal Grandfather   . Aneurysm Father   . Thyroid disease Neg Hx     Social  History   Socioeconomic History  . Marital status: Married    Spouse name: Not on file  . Number of children: Not on file  . Years of education: Not on file  . Highest education level: Not on file  Occupational History  . Not on file  Tobacco Use  . Smoking status: Never Smoker  . Smokeless tobacco: Never Used  Substance and Sexual Activity  . Alcohol use: Yes    Alcohol/week: 1.0 standard drinks    Types: 1 Standard drinks or equivalent per week  . Drug use: No  . Sexual activity: Yes    Partners: Male    Birth control/protection: I.U.D.  Other Topics Concern  . Not on file  Social History Narrative  . Not on file   Social Determinants of Health   Financial Resource Strain:   . Difficulty of Paying Living Expenses:   Food Insecurity:   . Worried About Charity fundraiser in the Last Year:   . Arboriculturist in the Last Year:   Transportation Needs:   . Film/video editor (Medical):   Marland Kitchen Lack of  Transportation (Non-Medical):   Physical Activity:   . Days of Exercise per Week:   . Minutes of Exercise per Session:   Stress:   . Feeling of Stress :   Social Connections:   . Frequency of Communication with Friends and Family:   . Frequency of Social Gatherings with Friends and Family:   . Attends Religious Services:   . Active Member of Clubs or Organizations:   . Attends Archivist Meetings:   Marland Kitchen Marital Status:   Intimate Partner Violence:   . Fear of Current or Ex-Partner:   . Emotionally Abused:   Marland Kitchen Physically Abused:   . Sexually Abused:     Review of Systems  Constitutional: Negative.   HENT: Negative.   Eyes: Negative.   Respiratory: Negative.   Cardiovascular: Negative.   Gastrointestinal: Negative.   Genitourinary: Negative.   Musculoskeletal: Negative.   Skin: Negative.   Neurological: Negative.   Endo/Heme/Allergies: Negative.   Psychiatric/Behavioral: Negative.   All other systems reviewed and are negative.   PHYSICAL EXAMINATION:    BP 128/64   Pulse 79   Temp 98.7 F (37.1 C)   Ht 5\' 5"  (1.651 m)   Wt 168 lb (76.2 kg)   SpO2 98%   BMI 27.96 kg/m     General appearance: alert, cooperative and appears stated age  ASSESSMENT Mood changes Weight gain Brain fog, fatigue    PLAN Start Celexa F/U in one month TSH She will continue with weight watchers and regular exercise We discussed perimenopause and HRT (she could just use ERT since she has a mirena IUD). I would not recommend this at this point.    Over 20 minutes was spent in total patient care

## 2019-08-30 LAB — TSH: TSH: 1.06 u[IU]/mL (ref 0.450–4.500)

## 2019-09-27 ENCOUNTER — Telehealth: Payer: Self-pay | Admitting: Obstetrics and Gynecology

## 2019-09-27 NOTE — Telephone Encounter (Signed)
Patient canceled her upcoming 1 month medication follow up appointment. She states she is "doing very well on the medication and will reschedule if needed".

## 2019-10-01 NOTE — Telephone Encounter (Signed)
Attempted to call pt. Pt voicemail box full on mobile number and home number rings busy. Will send mychart message for return call.

## 2019-10-01 NOTE — Telephone Encounter (Signed)
Can you please call and speak with the patient. She will need refills of the medication, if she is feeling great, then refill until her annual exam.

## 2019-10-03 ENCOUNTER — Ambulatory Visit: Payer: BC Managed Care – PPO | Admitting: Obstetrics and Gynecology

## 2019-10-03 MED ORDER — CITALOPRAM HYDROBROMIDE 20 MG PO TABS: ORAL_TABLET | ORAL | 5 refills | Status: DC

## 2019-10-03 NOTE — Telephone Encounter (Signed)
Have called x 3 and sent mychart messages for medication refills.   Routing to Dr Talbert Nan for update. Please advise.

## 2019-10-03 NOTE — Telephone Encounter (Signed)
Spoke with pt. 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 Pt states Celexa doing great and would like refills. Pt given update from Dr Talbert Nan and pt agreeable.   Rx Celexa # 30 tabs, 5 RF sent to pharmacy on file. Pharmacy verified. AEX 03/2020. Pt advised to keep AEX appt. Pt agreeable and thankful for call and refills.   Routing to Dr Talbert Nan.  Encounter closed.

## 2020-03-25 ENCOUNTER — Ambulatory Visit: Payer: BC Managed Care – PPO | Admitting: Certified Nurse Midwife

## 2020-03-25 NOTE — Progress Notes (Signed)
49 y.o. G32P1001 Married White or Caucasian Not Hispanic or Latino female here for annual exam.  She has a mirena IUD, placed in 12/17.  Occasional spotting. Sexually active, no pain.  Period Pattern:  (iud) Menstrual Flow: Light Menstrual Control: Panty liner Dysmenorrhea: None   She started on Celexa earlier this year. Helping mood and anxiety.   No LMP recorded. (Menstrual status: IUD).          Sexually active: Yes.    The current method of family planning is IUD.  Mirena iud inserted 04-29-16  Exercising: Yes.    zumba, total gym Smoker:  no  Health Maintenance: Pap:03-17-18 neg HPV HR neg, 08-20-14 neg HPV HR neg  History of abnormal Pap:  Yes, cryosurgery in her 20's. MMG:  05/16/19 Bi-rads 2 benign BMD:   none Colonoscopy: none  TDaP:  2017  Gardasil: n/a   reports that she has never smoked. She has never used smokeless tobacco. She reports current alcohol use of about 1.0 standard drink of alcohol per week. She reports that she does not use drugs. 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  Her daughter (local) is pregnant, having a little boy in January.   Past Medical History:  Diagnosis Date   Abnormal Pap smear    Hypertension    not currently on meds    Past Surgical History:  Procedure Laterality Date   CERVIX LESION DESTRUCTION     ?   DILATATION & CURETTAGE/HYSTEROSCOPY WITH MYOSURE N/A 04/21/2015   Procedure: DILATATION & CURETTAGE/HYSTEROSCOPY WITH MYOSURE;  Surgeon: [name], MD;  Location: [facility];  Service: Gynecology;  Laterality: N/A;   EYE SURGERY     INTRAUTERINE DEVICE (IUD) INSERTION  04/2016   VULVA SURGERY  07/20/10   bilateral revision labia minora    Current Outpatient Medications  Medication Sig Dispense Refill   citalopram (CELEXA) 20 MG tablet 1/2 a tablet a day, after one week if tolerating increase to one tablet a day. 30 tablet 5   Coenzyme Q10 (CO Q 10 PO) Take by mouth.     COLLAGEN  PO Take by mouth.     ibuprofen (ADVIL,MOTRIN) 200 MG tablet Take 400 mg by mouth every 6 (six) hours as needed for moderate pain.     Ibuprofen-diphenhydrAMINE Cit (ADVIL PM PO) Take by mouth as needed.     levonorgestrel (MIRENA) 20 MCG/24HR IUD 1 each by Intrauterine route once.     Multiple Vitamins-Minerals (MULTIVITAMIN PO) Take 1 tablet by mouth daily.     zolpidem (AMBIEN) 10 MG tablet One tablet at bedtime for insomnia related to travel 30 tablet 0   No current facility-administered medications for this visit.    Family History  Problem Relation Age of Onset   Heart disease Mother    Cirrhosis Mother    Heart disease Maternal Grandmother        pacemaker   Stroke Maternal Grandmother    Diabetes Maternal Aunt    Lung cancer Paternal Grandfather    Aneurysm Father    Thyroid disease Neg Hx     Review of Systems  Constitutional: Negative.   HENT: Negative.   Eyes: Negative.   Respiratory: Negative.   Cardiovascular: Negative.   Gastrointestinal: Negative.   Endocrine: Negative.   Genitourinary: Negative.   Musculoskeletal: Negative.   Skin: Negative.   Allergic/Immunologic: Negative.   Neurological: Negative.   Hematological: Negative.   Psychiatric/Behavioral: Negative.  Exam:   BP 120/80    Pulse 70    Resp 16    Ht 5' 4.5" (1.638 m)    Wt 172 lb (78 kg)    BMI 29.07 kg/m   Weight change: @WEIGHTCHANGE @ Height:   Height: 5' 4.5" (163.8 cm)  Ht Readings from Last 3 Encounters:  03/27/20 5' 4.5" (1.638 m)  08/29/19 5\' 5"  (1.651 m)  03/23/19 5' 4.5" (1.638 m)    General appearance: alert, cooperative and appears stated age Head: Normocephalic, without obvious abnormality, atraumatic Neck: no adenopathy, supple, symmetrical, trachea midline and thyroid normal to inspection and palpation Lungs: clear to auscultation bilaterally Cardiovascular: regular rate and rhythm Breasts: normal appearance, no masses or tenderness Abdomen: soft,  non-tender; non distended,  no masses,  no organomegaly Extremities: extremities normal, atraumatic, no cyanosis or edema Skin: Skin color, texture, turgor normal. No rashes or lesions Lymph nodes: Cervical, supraclavicular, and axillary nodes normal. No abnormal inguinal nodes palpated Neurologic: Grossly normal   Pelvic: External genitalia:  no lesions              Urethra:  normal appearing urethra with no masses, tenderness or lesions              Bartholins and Skenes: normal                 Vagina: normal appearing vagina with normal color and discharge, no lesions              Cervix: no lesions and IUD string 3+ cm               Bimanual Exam:  Uterus:  normal size, contour, position, consistency, mobility, non-tender              Adnexa: no mass, fullness, tenderness               Rectovaginal: Confirms               Anus:  normal sphincter tone, no lesions  Royal Hawthorn chaperoned for the exam.  A:  Well Woman with normal exam  IUD check, doing well  Doing well with Celexa, will continue  Request a refill on ambien that she only uses with travel. I will refill the 30 tablets for a year, if she needs more she needs to see her primary.  P:   No pap this year  Discussed options for colon cancer screening, will do the IFOB  Discussed breast self exam  Discussed calcium and vit D intake  Mammogram in 1/22  Screening labs, vit d

## 2020-03-27 ENCOUNTER — Ambulatory Visit: Payer: BC Managed Care – PPO | Admitting: Obstetrics and Gynecology

## 2020-03-27 ENCOUNTER — Other Ambulatory Visit: Payer: Self-pay

## 2020-03-27 ENCOUNTER — Encounter: Payer: Self-pay | Admitting: Obstetrics and Gynecology

## 2020-03-27 VITALS — BP 120/80 | HR 70 | Resp 16 | Ht 64.5 in | Wt 172.0 lb

## 2020-03-27 DIAGNOSIS — Z30431 Encounter for routine checking of intrauterine contraceptive device: Secondary | ICD-10-CM

## 2020-03-27 DIAGNOSIS — Z0000 Encounter for general adult medical examination without abnormal findings: Secondary | ICD-10-CM | POA: Diagnosis not present

## 2020-03-27 DIAGNOSIS — E559 Vitamin D deficiency, unspecified: Secondary | ICD-10-CM | POA: Diagnosis not present

## 2020-03-27 DIAGNOSIS — Z01419 Encounter for gynecological examination (general) (routine) without abnormal findings: Secondary | ICD-10-CM | POA: Diagnosis not present

## 2020-03-27 DIAGNOSIS — G4709 Other insomnia: Secondary | ICD-10-CM

## 2020-03-27 DIAGNOSIS — Z1211 Encounter for screening for malignant neoplasm of colon: Secondary | ICD-10-CM | POA: Diagnosis not present

## 2020-03-27 MED ORDER — CITALOPRAM HYDROBROMIDE 20 MG PO TABS: 20.0000 mg | ORAL_TABLET | Freq: Every day | ORAL | 3 refills | Status: DC

## 2020-03-27 MED ORDER — ZOLPIDEM TARTRATE 10 MG PO TABS: ORAL_TABLET | ORAL | 0 refills | Status: DC

## 2020-03-27 NOTE — Patient Instructions (Signed)
EXERCISE   We recommended that you start or continue a regular exercise program for good health. Physical activity is anything that gets your body moving, some is better than none. The CDC recommends 150 minutes per week of Moderate-Intensity Aerobic Activity and 2 or more days of Muscle Strengthening Activity.  Benefits of exercise are limitless: helps weight loss/weight maintenance, improves mood and energy, helps with depression and anxiety, improves sleep, tones and strengthens muscles, improves balance, improves bone density, protects from chronic conditions such as heart disease, high blood pressure and diabetes and so much more. To learn more visit: https://www.cdc.gov/physicalactivity/index.html  DIET: Good nutrition starts with a healthy diet of fruits, vegetables, whole grains, and lean protein sources. Drink plenty of water for hydration. Minimize empty calories, sodium, sweets. For more information about dietary recommendations visit: https://health.gov/our-work/nutrition-physical-activity/dietary-guidelines and https://www.myplate.gov/  ALCOHOL:  Women should limit their alcohol intake to no more than 7 drinks/beers/glasses of wine (combined, not each!) per week. Moderation of alcohol intake to this level decreases your risk of breast cancer and liver damage.  If you are concerned that you may have a problem, or your friends have told you they are concerned about your drinking, there are many resources to help. A well-known program that is free, effective, and available to all people all over the nation is Alcoholics Anonymous.  Check out this site to learn more: https://www.aa.org/   CALCIUM AND VITAMIN D:  Adequate intake of calcium and Vitamin D are recommended for bone health.  The recommendations for exact amounts of these supplements seem to change often, but generally speaking 1000-1500 mg of calcium (between diet and supplement) and 800 units of Vitamin D per day seems prudent.      PAP SMEARS:  Pap smears, to check for cervical cancer or precancers,  have traditionally been done yearly, although recent scientific advances have shown that most women can have pap smears less often.  However, every woman still should have a physical exam from her gynecologist every year. It will include a breast check, inspection of the vulva and vagina to check for abnormal growths or skin changes, a visual exam of the cervix, and then an exam to evaluate the size and shape of the uterus and ovaries.  And after 51 years of age, a rectal exam is indicated to check for rectal cancers. We will also provide age appropriate advice regarding health maintenance, like when you should have certain vaccines, screening for sexually transmitted diseases, bone density testing, colonoscopy, mammograms, etc.   MAMMOGRAMS:  All women over 40 years old should have a routine mammogram.   COLON CANCER SCREENING: Now recommend starting at age 45. At this time colonoscopy is not covered for routine screening until 50. There are take home tests that can be done between 45-49.   COLONOSCOPY:  Colonoscopy to screen for colon cancer is recommended for all women at age 50.  We know, you hate the idea of the prep.  We agree, BUT, having colon cancer and not knowing it is worse!!  Colon cancer so often starts as a polyp that can be seen and removed at colonscopy, which can quite literally save your life!  And if your first colonoscopy is normal and you have no family history of colon cancer, most women don't have to have it again for 10 years.  Once every ten years, you can do something that may end up saving your life, right?  We will be happy to help you get it scheduled when   you are ready.  Be sure to check your insurance coverage so you understand how much it will cost.  It may be covered as a preventative service at no cost, but you should check your particular policy.      Breast Self-Awareness Breast self-awareness  means being familiar with how your breasts look and feel. It involves checking your breasts regularly and reporting any changes to your health care provider. Practicing breast self-awareness is important. A change in your breasts can be a sign of a serious medical problem. Being familiar with how your breasts look and feel allows you to find any problems early, when treatment is more likely to be successful. All women should practice breast self-awareness, including women who have had breast implants. How to do a breast self-exam One way to learn what is normal for your breasts and whether your breasts are changing is to do a breast self-exam. To do a breast self-exam: Look for Changes  1. Remove all the clothing above your waist. 2. Stand in front of a mirror in a room with good lighting. 3. Put your hands on your hips. 4. Push your hands firmly downward. 5. Compare your breasts in the mirror. Look for differences between them (asymmetry), such as: ? Differences in shape. ? Differences in size. ? Puckers, dips, and bumps in one breast and not the other. 6. Look at each breast for changes in your skin, such as: ? Redness. ? Scaly areas. 7. Look for changes in your nipples, such as: ? Discharge. ? Bleeding. ? Dimpling. ? Redness. ? A change in position. Feel for Changes Carefully feel your breasts for lumps and changes. It is best to do this while lying on your back on the floor and again while sitting or standing in the shower or tub with soapy water on your skin. Feel each breast in the following way:  Place the arm on the side of the breast you are examining above your head.  Feel your breast with the other hand.  Start in the nipple area and make  inch (2 cm) overlapping circles to feel your breast. Use the pads of your three middle fingers to do this. Apply light pressure, then medium pressure, then firm pressure. The light pressure will allow you to feel the tissue closest to the  skin. The medium pressure will allow you to feel the tissue that is a little deeper. The firm pressure will allow you to feel the tissue close to the ribs.  Continue the overlapping circles, moving downward over the breast until you feel your ribs below your breast.  Move one finger-width toward the center of the body. Continue to use the  inch (2 cm) overlapping circles to feel your breast as you move slowly up toward your collarbone.  Continue the up and down exam using all three pressures until you reach your armpit.  Write Down What You Find  Write down what is normal for each breast and any changes that you find. Keep a written record with breast changes or normal findings for each breast. By writing this information down, you do not need to depend only on memory for size, tenderness, or location. Write down where you are in your menstrual cycle, if you are still menstruating. If you are having trouble noticing differences in your breasts, do not get discouraged. With time you will become more familiar with the variations in your breasts and more comfortable with the exam. How often should I examine   my breasts? Examine your breasts every month. If you are breastfeeding, the best time to examine your breasts is after a feeding or after using a breast pump. If you menstruate, the best time to examine your breasts is 5-7 days after your period is over. During your period, your breasts are lumpier, and it may be more difficult to notice changes. When should I see my health care provider? See your health care provider if you notice:  A change in shape or size of your breasts or nipples.  A change in the skin of your breast or nipples, such as a reddened or scaly area.  Unusual discharge from your nipples.  A lump or thick area that was not there before.  Pain in your breasts.  Anything that concerns you.  

## 2020-03-29 LAB — VITAMIN D 25 HYDROXY (VIT D DEFICIENCY, FRACTURES): Vit D, 25-Hydroxy: 47.4 ng/mL (ref 30.0–100.0)

## 2020-03-29 LAB — CBC WITH DIFFERENTIAL/PLATELET
Basophils Absolute: 0.1 10*3/uL (ref 0.0–0.2)
Basos: 1 %
EOS (ABSOLUTE): 0.1 10*3/uL (ref 0.0–0.4)
Eos: 1 %
Hematocrit: 42 % (ref 34.0–46.6)
Hemoglobin: 14.2 g/dL (ref 11.1–15.9)
Immature Grans (Abs): 0 10*3/uL (ref 0.0–0.1)
Immature Granulocytes: 1 %
Lymphocytes Absolute: 1.9 10*3/uL (ref 0.7–3.1)
Lymphs: 22 %
MCH: 32.4 pg (ref 26.6–33.0)
MCHC: 33.8 g/dL (ref 31.5–35.7)
MCV: 96 fL (ref 79–97)
Monocytes Absolute: 0.7 10*3/uL (ref 0.1–0.9)
Monocytes: 8 %
Neutrophils Absolute: 6.1 10*3/uL (ref 1.4–7.0)
Neutrophils: 67 %
Platelets: 420 10*3/uL (ref 150–450)
RBC: 4.38 x10E6/uL (ref 3.77–5.28)
RDW: 11.9 % (ref 11.7–15.4)
WBC: 8.8 10*3/uL (ref 3.4–10.8)

## 2020-03-29 LAB — COMPREHENSIVE METABOLIC PANEL
ALT: 28 IU/L (ref 0–32)
AST: 24 IU/L (ref 0–40)
Albumin/Globulin Ratio: 2.4 — ABNORMAL HIGH (ref 1.2–2.2)
Albumin: 4.6 g/dL (ref 3.8–4.9)
Alkaline Phosphatase: 58 IU/L (ref 44–121)
BUN/Creatinine Ratio: 17 (ref 9–23)
BUN: 13 mg/dL (ref 6–24)
Bilirubin Total: 0.4 mg/dL (ref 0.0–1.2)
CO2: 26 mmol/L (ref 20–29)
Calcium: 9.2 mg/dL (ref 8.7–10.2)
Chloride: 97 mmol/L (ref 96–106)
Creatinine, Ser: 0.78 mg/dL (ref 0.57–1.00)
GFR calc Af Amer: 102 mL/min/{1.73_m2} (ref >59)
GFR calc non Af Amer: 88 mL/min/{1.73_m2} (ref >59)
Globulin, Total: 1.9 g/dL (ref 1.5–4.5)
Glucose: 88 mg/dL (ref 65–99)
Potassium: 4.5 mmol/L (ref 3.5–5.2)
Sodium: 138 mmol/L (ref 134–144)
Total Protein: 6.5 g/dL (ref 6.0–8.5)

## 2020-03-29 LAB — LIPID PANEL
Chol/HDL Ratio: 2.5 ratio (ref 0.0–4.4)
Cholesterol, Total: 197 mg/dL (ref 100–199)
HDL: 79 mg/dL (ref >39)
LDL Chol Calc (NIH): 104 mg/dL — ABNORMAL HIGH (ref 0–99)
Triglycerides: 78 mg/dL (ref 0–149)
VLDL Cholesterol Cal: 14 mg/dL (ref 5–40)

## 2020-08-16 LAB — FECAL OCCULT BLOOD, IMMUNOCHEMICAL

## 2020-08-19 ENCOUNTER — Telehealth: Payer: Self-pay | Admitting: *Deleted

## 2020-08-19 NOTE — Telephone Encounter (Signed)
-----   Message from [name], MD sent at 08/17/2020  2:16 PM EDT ----- Please inform the patient, offer her another test.

## 2020-08-19 NOTE — Telephone Encounter (Signed)
Attempted to reach patient to review results. Voicemail full, unable to leave a message at this time.

## 2020-09-22 ENCOUNTER — Other Ambulatory Visit: Payer: Self-pay | Admitting: *Deleted

## 2020-09-22 DIAGNOSIS — Z1211 Encounter for screening for malignant neoplasm of colon: Secondary | ICD-10-CM

## 2020-10-01 ENCOUNTER — Other Ambulatory Visit: Payer: Self-pay | Admitting: Family

## 2020-10-01 DIAGNOSIS — Z1231 Encounter for screening mammogram for malignant neoplasm of breast: Secondary | ICD-10-CM

## 2020-12-03 ENCOUNTER — Ambulatory Visit
Admission: RE | Admit: 2020-12-03 | Discharge: 2020-12-03 | Disposition: A | Discharge status: Home / Self Care | Payer: BC Managed Care – PPO | Source: Ambulatory Visit | Attending: Family | Admitting: Family

## 2020-12-03 ENCOUNTER — Other Ambulatory Visit: Payer: Self-pay

## 2020-12-03 DIAGNOSIS — Z1231 Encounter for screening mammogram for malignant neoplasm of breast: Secondary | ICD-10-CM

## 2021-04-06 NOTE — Progress Notes (Signed)
50 y.o. G65P1001 Married White or Caucasian Not Hispanic or Latino female here for annual exam.  She has a mirena IUD, placed in 12/17. She has not had a period in the last year. She occasionally gets hot, but only 2 real hot flashes. No night sweats. Sexually active, no pain.   No bowel or bladder issues. Occasional constipation.     No LMP recorded. (Menstrual status: IUD).          Sexually active: Yes.    The current method of family planning is IUD.   Mirena IUD inserted 12/17  Exercising: Yes.     Total gym and hula hoop  Smoker:  no  Health Maintenance: Pap:  03-17-18 neg HPV HR neg, 08-20-14 neg HPV HR neg  History of abnormal Pap:  yes Cryo surgery in her 20's  MMG:  12/08/20 density B Bi-rads 1 neg  BMD:   n/a Colonoscopy: none  Tried to do an IFOB last year, issue with processing.  TDaP:  2017  Gardasil: n/a   reports that she has never smoked. She has never used smokeless tobacco. She reports current alcohol use of about 1.0 standard drink per week. She reports that she does not use drugs. 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 Her daughter and 60 month old grandson are local.  Past Medical History:  Diagnosis Date   Abnormal Pap smear    Hypertension    not currently on meds    Past Surgical History:  Procedure Laterality Date   CERVIX LESION DESTRUCTION     ?   DILATATION & CURETTAGE/HYSTEROSCOPY WITH MYOSURE N/A 04/21/2015   Procedure: DILATATION & CURETTAGE/HYSTEROSCOPY WITH MYOSURE;  Surgeon: [name], MD;  Location: [facility];  Service: Gynecology;  Laterality: N/A;   EYE SURGERY     INTRAUTERINE DEVICE (IUD) INSERTION  04/2016   VULVA SURGERY  07/20/10   bilateral revision labia minora    Current Outpatient Medications  Medication Sig Dispense Refill   citalopram (CELEXA) 20 MG tablet Take 1 tablet (20 mg total) by mouth daily. 90 tablet 3   Coenzyme Q10 (CO Q 10 PO) Take by mouth.     COLLAGEN PO Take by mouth.      ibuprofen (ADVIL,MOTRIN) 200 MG tablet Take 400 mg by mouth every 6 (six) hours as needed for moderate pain.     Ibuprofen-diphenhydrAMINE Cit (ADVIL PM PO) Take by mouth as needed.     levonorgestrel (MIRENA) 20 MCG/24HR IUD 1 each by Intrauterine route once.     Multiple Vitamins-Minerals (MULTIVITAMIN PO) Take 1 tablet by mouth daily.     zolpidem (AMBIEN) 10 MG tablet One tablet at bedtime for insomnia related to travel 30 tablet 0   No current facility-administered medications for this visit.    Family History  Problem Relation Age of Onset   Heart disease Mother    Cirrhosis Mother    Aneurysm Father    Diabetes Maternal Aunt    Heart disease Maternal Grandmother        pacemaker   Stroke Maternal Grandmother    Lung cancer Paternal Grandfather    Thyroid disease Neg Hx    Breast cancer Neg Hx     Review of Systems  All other systems reviewed and are negative.  Exam:   BP 120/80   Pulse 78   Ht 5\' 5"  (1.651 m)   Wt 173 lb 12.8 oz (78.8 kg)   SpO2 99%  BMI 28.92 kg/m   Weight change: @WEIGHTCHANGE @ Height:   Height: 5\' 5"  (165.1 cm)  Ht Readings from Last 3 Encounters:  04/08/21 5\' 5"  (1.651 m)  03/27/20 5' 4.5" (1.638 m)  08/29/19 5\' 5"  (1.651 m)    General appearance: alert, cooperative and appears stated age Head: Normocephalic, without obvious abnormality, atraumatic Neck: no adenopathy, supple, symmetrical, trachea midline and thyroid  right lobe > left Lungs: clear to auscultation bilaterally Cardiovascular: regular rate and rhythm Breasts: normal appearance, no masses or tenderness Abdomen: soft, non-tender; non distended,  no masses,  no organomegaly Extremities: extremities normal, atraumatic, no cyanosis or edema Skin: Skin color, texture, turgor normal. No rashes or lesions Lymph nodes: Cervical, supraclavicular, and axillary nodes normal. No abnormal inguinal nodes palpated Neurologic: Grossly normal   Pelvic: External genitalia:  no  lesions              Urethra:  normal appearing urethra with no masses, tenderness or lesions              Bartholins and Skenes: normal                 Vagina: normal appearing vagina with normal color and discharge, no lesions              Cervix: no lesions and IUD string 5 cm               Bimanual Exam:  Uterus:   no masses or tenderness              Adnexa: no mass, fullness, tenderness               Rectovaginal: Confirms               Anus:  normal sphincter tone, no lesions  Gae Dry chaperoned for the exam.  1. Well woman exam Discussed breast self exam Discussed calcium and vit D intake No pap this year Mammogram UTD  2. Colon cancer screening Aware of choices - Fecal occult blood, imunochemical  3. IUD check up Doing well  4. Laboratory exam ordered as part of routine general medical examination - CBC - Comprehensive metabolic panel - Lipid panel   5. Right thyroid nodule Overdue with f/u with Endocrinology - Thyroid Panel With TSH

## 2021-04-08 ENCOUNTER — Telehealth: Payer: Self-pay | Admitting: Obstetrics and Gynecology

## 2021-04-08 ENCOUNTER — Other Ambulatory Visit: Payer: Self-pay

## 2021-04-08 ENCOUNTER — Ambulatory Visit (INDEPENDENT_AMBULATORY_CARE_PROVIDER_SITE_OTHER): Payer: BC Managed Care – PPO | Admitting: Obstetrics and Gynecology

## 2021-04-08 ENCOUNTER — Encounter: Payer: Self-pay | Admitting: Obstetrics and Gynecology

## 2021-04-08 VITALS — BP 120/80 | HR 78 | Ht 65.0 in | Wt 173.8 lb

## 2021-04-08 DIAGNOSIS — Z1211 Encounter for screening for malignant neoplasm of colon: Secondary | ICD-10-CM | POA: Diagnosis not present

## 2021-04-08 DIAGNOSIS — E041 Nontoxic single thyroid nodule: Secondary | ICD-10-CM

## 2021-04-08 DIAGNOSIS — Z0000 Encounter for general adult medical examination without abnormal findings: Secondary | ICD-10-CM | POA: Diagnosis not present

## 2021-04-08 DIAGNOSIS — Z30431 Encounter for routine checking of intrauterine contraceptive device: Secondary | ICD-10-CM | POA: Diagnosis not present

## 2021-04-08 DIAGNOSIS — Z01419 Encounter for gynecological examination (general) (routine) without abnormal findings: Secondary | ICD-10-CM

## 2021-04-08 NOTE — Patient Instructions (Signed)
EXERCISE   We recommended that you start or continue a regular exercise program for good health. Physical activity is anything that gets your body moving, some is better than none. The CDC recommends 150 minutes per week of Moderate-Intensity Aerobic Activity and 2 or more days of Muscle Strengthening Activity.  Benefits of exercise are limitless: helps weight loss/weight maintenance, improves mood and energy, helps with depression and anxiety, improves sleep, tones and strengthens muscles, improves balance, improves bone density, protects from chronic conditions such as heart disease, high blood pressure and diabetes and so much more. To learn more visit: https://www.cdc.gov/physicalactivity/index.html  DIET: Good nutrition starts with a healthy diet of fruits, vegetables, whole grains, and lean protein sources. Drink plenty of water for hydration. Minimize empty calories, sodium, sweets. For more information about dietary recommendations visit: https://health.gov/our-work/nutrition-physical-activity/dietary-guidelines and https://www.myplate.gov/  ALCOHOL:  Women should limit their alcohol intake to no more than 7 drinks/beers/glasses of wine (combined, not each!) per week. Moderation of alcohol intake to this level decreases your risk of breast cancer and liver damage.  If you are concerned that you may have a problem, or your friends have told you they are concerned about your drinking, there are many resources to help. A well-known program that is free, effective, and available to all people all over the nation is Alcoholics Anonymous.  Check out this site to learn more: https://www.aa.org/   CALCIUM AND VITAMIN D:  Adequate intake of calcium and Vitamin D are recommended for bone health.  You should be getting between 1000-1200 mg of calcium and 800 units of Vitamin D daily between diet and supplements  PAP SMEARS:  Pap smears, to check for cervical cancer or precancers,  have traditionally been  done yearly, scientific advances have shown that most women can have pap smears less often.  However, every woman still should have a physical exam from her gynecologist every year. It will include a breast check, inspection of the vulva and vagina to check for abnormal growths or skin changes, a visual exam of the cervix, and then an exam to evaluate the size and shape of the uterus and ovaries. We will also provide age appropriate advice regarding health maintenance, like when you should have certain vaccines, screening for sexually transmitted diseases, bone density testing, colonoscopy, mammograms, etc.   MAMMOGRAMS:  All women over 40 years old should have a routine mammogram.   COLON CANCER SCREENING: Now recommend starting at age 45. At this time colonoscopy is not covered for routine screening until 50. There are take home tests that can be done between 45-49.   COLONOSCOPY:  Colonoscopy to screen for colon cancer is recommended for all women at age 50.  We know, you hate the idea of the prep.  We agree, BUT, having colon cancer and not knowing it is worse!!  Colon cancer so often starts as a polyp that can be seen and removed at colonscopy, which can quite literally save your life!  And if your first colonoscopy is normal and you have no family history of colon cancer, most women don't have to have it again for 10 years.  Once every ten years, you can do something that may end up saving your life, right?  We will be happy to help you get it scheduled when you are ready.  Be sure to check your insurance coverage so you understand how much it will cost.  It may be covered as a preventative service at no cost, but you should check   your particular policy.      Breast Self-Awareness Breast self-awareness means being familiar with how your breasts look and feel. It involves checking your breasts regularly and reporting any changes to your health care provider. Practicing breast self-awareness is  important. A change in your breasts can be a sign of a serious medical problem. Being familiar with how your breasts look and feel allows you to find any problems early, when treatment is more likely to be successful. All women should practice breast self-awareness, including women who have had breast implants. How to do a breast self-exam One way to learn what is normal for your breasts and whether your breasts are changing is to do a breast self-exam. To do a breast self-exam: Look for Changes  Remove all the clothing above your waist. Stand in front of a mirror in a room with good lighting. Put your hands on your hips. Push your hands firmly downward. Compare your breasts in the mirror. Look for differences between them (asymmetry), such as: Differences in shape. Differences in size. Puckers, dips, and bumps in one breast and not the other. Look at each breast for changes in your skin, such as: Redness. Scaly areas. Look for changes in your nipples, such as: Discharge. Bleeding. Dimpling. Redness. A change in position. Feel for Changes Carefully feel your breasts for lumps and changes. It is best to do this while lying on your back on the floor and again while sitting or standing in the shower or tub with soapy water on your skin. Feel each breast in the following way: Place the arm on the side of the breast you are examining above your head. Feel your breast with the other hand. Start in the nipple area and make  inch (2 cm) overlapping circles to feel your breast. Use the pads of your three middle fingers to do this. Apply light pressure, then medium pressure, then firm pressure. The light pressure will allow you to feel the tissue closest to the skin. The medium pressure will allow you to feel the tissue that is a little deeper. The firm pressure will allow you to feel the tissue close to the ribs. Continue the overlapping circles, moving downward over the breast until you feel your  ribs below your breast. Move one finger-width toward the center of the body. Continue to use the  inch (2 cm) overlapping circles to feel your breast as you move slowly up toward your collarbone. Continue the up and down exam using all three pressures until you reach your armpit.  Write Down What You Find  Write down what is normal for each breast and any changes that you find. Keep a written record with breast changes or normal findings for each breast. By writing this information down, you do not need to depend only on memory for size, tenderness, or location. Write down where you are in your menstrual cycle, if you are still menstruating. If you are having trouble noticing differences in your breasts, do not get discouraged. With time you will become more familiar with the variations in your breasts and more comfortable with the exam. How often should I examine my breasts? Examine your breasts every month. If you are breastfeeding, the best time to examine your breasts is after a feeding or after using a breast pump. If you menstruate, the best time to examine your breasts is 5-7 days after your period is over. During your period, your breasts are lumpier, and it may be more   difficult to notice changes. When should I see my health care provider? See your health care provider if you notice: A change in shape or size of your breasts or nipples. A change in the skin of your breast or nipples, such as a reddened or scaly area. Unusual discharge from your nipples. A lump or thick area that was not there before. Pain in your breasts. Anything that concerns you. Menopause Menopause is the normal time of a woman's life when menstrual periods stop completely. It marks the natural end to a woman's ability to become pregnant. It can be defined as the absence of a menstrual period for 12 months without another medical cause. The transition to menopause (perimenopause) most often happens between the ages  of 35 and 61, and can last for many years. During perimenopause, hormone levels change in your body, which can cause symptoms and affect your health. Menopause may increase your risk for: Weakened bones (osteoporosis), which causes fractures. Depression. Hardening and narrowing of the arteries (atherosclerosis), which can cause heart attacks and strokes. What are the causes? This condition is usually caused by a natural change in hormone levels that happens as you get older. The condition may also be caused by changes that are not natural, including: Surgery to remove both ovaries (surgical menopause). Side effects from some medicines, such as chemotherapy used to treat cancer (chemical menopause). What increases the risk? This condition is more likely to start at an earlier age if you have certain medical conditions or have undergone treatments, including: A tumor of the pituitary gland in the brain. A disease that affects the ovaries and hormones. Certain cancer treatments, such as chemotherapy or hormone therapy, or radiation therapy on the pelvis. Heavy smoking and excessive alcohol use. Family history of early menopause. This condition is also more likely to develop earlier in women who are very thin. What are the signs or symptoms? Symptoms of this condition include: Hot flashes. Irregular menstrual periods. Night sweats. Changes in feelings about sex. This could be a decrease in sex drive or an increased discomfort around your sexuality. Vaginal dryness and thinning of the vaginal walls. This may cause painful sex. Dryness of the skin and development of wrinkles. Headaches. Problems sleeping (insomnia). Mood swings or irritability. Memory problems. Weight gain. Hair growth on the face and chest. Bladder infections or problems with urinating. How is this diagnosed? This condition is diagnosed based on your medical history, a physical exam, your age, your menstrual history, and  your symptoms. Hormone tests may also be done. How is this treated? In some cases, no treatment is needed. You and your health care provider should make a decision together about whether treatment is necessary. Treatment will be based on your individual condition and preferences. Treatment for this condition focuses on managing symptoms. Treatment may include: Menopausal hormone therapy (MHT). Medicines to treat specific symptoms or complications. Acupuncture. Vitamin or herbal supplements. Before starting treatment, make sure to let your health care provider know if you have a personal or family history of these conditions: Heart disease. Breast cancer. Blood clots. Diabetes. Osteoporosis. Follow these instructions at home: Lifestyle Do not use any products that contain nicotine or tobacco, such as cigarettes, e-cigarettes, and chewing tobacco. If you need help quitting, ask your health care provider. Get at least 30 minutes of physical activity on 5 or more days each week. Avoid alcoholic and caffeinated beverages, as well as spicy foods. This may help prevent hot flashes. Get 7-8 hours of sleep  each night. If you have hot flashes, try: Dressing in layers. Avoiding things that may trigger hot flashes, such as spicy food, warm places, or stress. Taking slow, deep breaths when a hot flash starts. Keeping a fan in your home and office. Find ways to manage stress, such as deep breathing, meditation, or journaling. Consider going to group therapy with other women who are having menopause symptoms. Ask your health care provider about recommended group therapy meetings. Eating and drinking  Eat a healthy, balanced diet that contains whole grains, lean protein, low-fat dairy, and plenty of fruits and vegetables. Your health care provider may recommend adding more soy to your diet. Foods that contain soy include tofu, tempeh, and soy milk. Eat plenty of foods that contain calcium and vitamin D  for bone health. Items that are rich in calcium include low-fat milk, yogurt, beans, almonds, sardines, broccoli, and kale. Medicines Take over-the-counter and prescription medicines only as told by your health care provider. Talk with your health care provider before starting any herbal supplements. If prescribed, take vitamins and supplements as told by your health care provider. General instructions  Keep track of your menstrual periods, including: When they occur. How heavy they are and how long they last. How much time passes between periods. Keep track of your symptoms, noting when they start, how often you have them, and how long they last. Use vaginal lubricants or moisturizers to help with vaginal dryness and improve comfort during sex. Keep all follow-up visits. This is important. This includes any group therapy or counseling. Contact a health care provider if: You are still having menstrual periods after age 34. You have pain during sex. You have not had a period for 12 months and you develop vaginal bleeding. Get help right away if you have: Severe depression. Excessive vaginal bleeding. Pain when you urinate. A fast or irregular heartbeat (palpitations). Severe headaches. Abdominal pain or severe indigestion. Summary Menopause is a normal time of life when menstrual periods stop completely. It is usually defined as the absence of a menstrual period for 12 months without another medical cause. The transition to menopause (perimenopause) most often happens between the ages of 39 and 57 and can last for several years. Symptoms can be managed through medicines, lifestyle changes, and complementary therapies such as acupuncture. Eat a balanced diet that is rich in nutrients to promote bone health and heart health and to manage symptoms during menopause. This information is not intended to replace advice given to you by your health care provider. Make sure you discuss any  questions you have with your health care provider. Document Revised: 01/25/2020 Document Reviewed: 10/11/2019 Elsevier Patient Education  [place].

## 2021-04-09 ENCOUNTER — Telehealth: Payer: Self-pay | Admitting: *Deleted

## 2021-04-09 DIAGNOSIS — E041 Nontoxic single thyroid nodule: Secondary | ICD-10-CM

## 2021-04-09 LAB — CBC
HCT: 44 % (ref 35.0–45.0)
Hemoglobin: 15 g/dL (ref 11.7–15.5)
MCH: 32.7 pg (ref 27.0–33.0)
MCHC: 34.1 g/dL (ref 32.0–36.0)
MCV: 95.9 fL (ref 80.0–100.0)
MPV: 10.8 fL (ref 7.5–12.5)
Platelets: 319 10*3/uL (ref 140–400)
RBC: 4.59 10*6/uL (ref 3.80–5.10)
RDW: 11.6 % (ref 11.0–15.0)
WBC: 7.5 10*3/uL (ref 3.8–10.8)

## 2021-04-09 LAB — LIPID PANEL
Cholesterol: 213 mg/dL — ABNORMAL HIGH (ref <200)
HDL: 80 mg/dL (ref >=50)
LDL Cholesterol (Calc): 109 mg/dL (calc) — ABNORMAL HIGH
Non-HDL Cholesterol (Calc): 133 mg/dL (calc) — ABNORMAL HIGH (ref <130)
Total CHOL/HDL Ratio: 2.7 (calc) (ref <5.0)
Triglycerides: 125 mg/dL (ref <150)

## 2021-04-09 LAB — COMPREHENSIVE METABOLIC PANEL
AG Ratio: 2 (calc) (ref 1.0–2.5)
ALT: 21 U/L (ref 6–29)
AST: 21 U/L (ref 10–35)
Albumin: 4.7 g/dL (ref 3.6–5.1)
Alkaline phosphatase (APISO): 79 U/L (ref 37–153)
BUN: 21 mg/dL (ref 7–25)
CO2: 28 mmol/L (ref 20–32)
Calcium: 10.1 mg/dL (ref 8.6–10.4)
Chloride: 102 mmol/L (ref 98–110)
Creat: 0.9 mg/dL (ref 0.50–1.03)
Globulin: 2.3 g/dL (calc) (ref 1.9–3.7)
Glucose, Bld: 91 mg/dL (ref 65–99)
Potassium: 4.9 mmol/L (ref 3.5–5.3)
Sodium: 139 mmol/L (ref 135–146)
Total Bilirubin: 0.3 mg/dL (ref 0.2–1.2)
Total Protein: 7 g/dL (ref 6.1–8.1)

## 2021-04-09 LAB — THYROID PANEL WITH TSH
Free Thyroxine Index: 2.4 (ref 1.4–3.8)
T3 Uptake: 31 % (ref 22–35)
T4, Total: 7.6 ug/dL (ref 5.1–11.9)
TSH: 1.02 mIU/L

## 2021-04-09 NOTE — Telephone Encounter (Signed)
Referral placed at [facility] to call and schedule.

## 2021-04-09 NOTE — Telephone Encounter (Signed)
-----   Message from [name], MD sent at 04/08/2021  3:50 PM EST ----- She has a known right thyroid nodule. She was supposed to f/u with Dr Loanne Drilling several years ago but didn't. Can you please get her in for f/u. Thanks, Sharee Pimple

## 2021-04-21 NOTE — Telephone Encounter (Signed)
Referral placed.

## 2021-04-22 NOTE — Telephone Encounter (Signed)
I finally got someone on phone from this office. I spoke with Belenda Cruise and she sent chart to Dr.Ellison for approval. Once she hears a response from Dr. Loanne Drilling she will reach out to patient.

## 2021-04-22 NOTE — Telephone Encounter (Signed)
I called [facility] multiple times and not able to speak with anyone via phone with update on referral.

## 2021-05-06 NOTE — Telephone Encounter (Signed)
Staff message sent to [facility] referral coordinator for update regarding the below.

## 2021-05-12 NOTE — Telephone Encounter (Signed)
Per staff message from referral coordinator Belenda Cruise  "This is ready for scheduling - we just have not had time or staffing to reach out to patient directly as of yet.  Patient can reach out to [place] if they would like since we are currently behind calling patients.  I hope to be given some dedicated time for referrals soon.   Thanks, Belenda Cruise"   I sent my chart message to patient asking patient to call [facility].

## 2021-06-15 ENCOUNTER — Other Ambulatory Visit: Payer: Self-pay

## 2021-06-15 ENCOUNTER — Ambulatory Visit: Payer: BC Managed Care – PPO | Admitting: Endocrinology

## 2021-06-15 VITALS — BP 124/70 | HR 94 | Ht 65.0 in | Wt 174.4 lb

## 2021-06-15 DIAGNOSIS — E041 Nontoxic single thyroid nodule: Secondary | ICD-10-CM

## 2021-06-15 NOTE — Progress Notes (Signed)
Subjective:    Patient ID: Carla Woodard, female    DOB: 10-Sep-1970, 51 y.o.   MRN: [id number]  HPI Pt is referred by Dr Talbert Nan, for nodular thyroid.  I last saw pt in 2016.  Pt was noted to have a thyroid nodule in 2016.  bx then showed cat 2.  she has no h/o XRT or surgery to the neck.  She does not notice the nodule.  She has never taken thyroid medication.   Past Medical History:  Diagnosis Date   Abnormal Pap smear    Hypertension    not currently on meds    Past Surgical History:  Procedure Laterality Date   CERVIX LESION DESTRUCTION     ?   DILATATION & CURETTAGE/HYSTEROSCOPY WITH MYOSURE N/A 04/21/2015   Procedure: DILATATION & CURETTAGE/HYSTEROSCOPY WITH MYOSURE;  Surgeon: [name], MD;  Location: [facility];  Service: Gynecology;  Laterality: N/A;   EYE SURGERY     INTRAUTERINE DEVICE (IUD) INSERTION  04/2016   VULVA SURGERY  07/20/10   bilateral revision labia minora    Social History   Socioeconomic History   Marital status: Married    Spouse name: Not on file   Number of children: Not on file   Years of education: Not on file   Highest education level: Not on file  Occupational History   Not on file  Tobacco Use   Smoking status: Never   Smokeless tobacco: Never  Vaping Use   Vaping Use: Never used  Substance and Sexual Activity   Alcohol use: Yes    Alcohol/week: 1.0 standard drink    Types: 1 Standard drinks or equivalent per week   Drug use: No   Sexual activity: Yes    Partners: Male    Birth control/protection: I.U.D.  Other Topics Concern   Not on file  Social History Narrative   Not on file   Social Determinants of Health   Financial Resource Strain: Not on file  Food Insecurity: Not on file  Transportation Needs: Not on file  Physical Activity: Not on file  Stress: Not on file  Social Connections: Not on file  Intimate Partner Violence: Not on file    Current Outpatient Medications on File Prior to Visit  Medication Sig  Dispense Refill   citalopram (CELEXA) 20 MG tablet Take 1 tablet (20 mg total) by mouth daily. 90 tablet 3   Coenzyme Q10 (CO Q 10 PO) Take by mouth.     COLLAGEN PO Take by mouth.     ibuprofen (ADVIL,MOTRIN) 200 MG tablet Take 400 mg by mouth every 6 (six) hours as needed for moderate pain.     Ibuprofen-diphenhydrAMINE Cit (ADVIL PM PO) Take by mouth as needed.     levonorgestrel (MIRENA) 20 MCG/24HR IUD 1 each by Intrauterine route once.     Multiple Vitamins-Minerals (MULTIVITAMIN PO) Take 1 tablet by mouth daily.     zolpidem (AMBIEN) 10 MG tablet One tablet at bedtime for insomnia related to travel 30 tablet 0   No current facility-administered medications on file prior to visit.    Allergies  Allergen Reactions   Latex Rash    Family History  Problem Relation Age of Onset   Heart disease Mother    Cirrhosis Mother    Aneurysm Father    Diabetes Maternal Aunt    Heart disease Maternal Grandmother        pacemaker   Stroke Maternal Grandmother    Lung cancer  Paternal Grandfather    Thyroid disease Neg Hx    Breast cancer Neg Hx     BP 124/70    Pulse 94    Ht 5\' 5"  (1.651 m)    Wt 174 lb 6.4 oz (79.1 kg)    SpO2 98%    BMI 29.02 kg/m    Review of Systems Denies weight change, hoarseness, neck pain, and sob.      Objective:   Physical Exam VITAL SIGNS:  See vs page GENERAL: no distress NECK: bilat 1-2 cm thyroid nodules.  No palp LN's.     Lab Results  Component Value Date   TSH 1.02 04/08/2021   T4TOTAL 7.6 04/08/2021   [place] (2016): 2.5 cm right inferior pole complex solid cystic nodule.    I have reviewed outside records, and summarized: Pt was noted to have MNG, and referred here.  She was seen for GYN wellness visit, and it was noted that she was overdue for thyroid f/u.      Assessment & Plan:  MNG, persistent.  I told pt that no medication would help this.  Patient Instructions  Let's recheck the ultrasound.  you will receive a phone call, about a  day and time for an appointment.  If you would rather, you can call [facility] radiology for an appointment.

## 2021-06-15 NOTE — Patient Instructions (Signed)
Let's recheck the ultrasound.  you will receive a phone call, about a day and time for an appointment.  If you would rather, you can call [facility] radiology for an appointment.

## 2021-08-04 ENCOUNTER — Telehealth: Payer: Self-pay

## 2021-08-04 NOTE — Telephone Encounter (Signed)
Patient wanted to inquire about the status of the Referral for thyroid scan in [place] or [employer] since discussed at last appt.  ?

## 2021-08-05 NOTE — Telephone Encounter (Signed)
Spoke with patient and gave her central scheduling phone number.  She will call and schedule appointment. ?

## 2021-08-14 ENCOUNTER — Ambulatory Visit (HOSPITAL_COMMUNITY)
Admission: RE | Admit: 2021-08-14 | Discharge: 2021-08-14 | Disposition: A | Discharge status: Home / Self Care | Payer: BC Managed Care – PPO | Source: Ambulatory Visit | Attending: Endocrinology | Admitting: Endocrinology

## 2021-08-14 DIAGNOSIS — E041 Nontoxic single thyroid nodule: Secondary | ICD-10-CM | POA: Insufficient documentation

## 2021-12-24 ENCOUNTER — Other Ambulatory Visit: Payer: Self-pay | Admitting: Family

## 2021-12-24 DIAGNOSIS — Z1231 Encounter for screening mammogram for malignant neoplasm of breast: Secondary | ICD-10-CM

## 2022-01-01 ENCOUNTER — Encounter: Payer: Self-pay | Admitting: Nurse Practitioner

## 2022-01-01 ENCOUNTER — Ambulatory Visit (INDEPENDENT_AMBULATORY_CARE_PROVIDER_SITE_OTHER): Payer: BC Managed Care – PPO | Admitting: Nurse Practitioner

## 2022-01-01 ENCOUNTER — Ambulatory Visit
Admission: RE | Admit: 2022-01-01 | Discharge: 2022-01-01 | Disposition: A | Discharge status: Home / Self Care | Payer: BC Managed Care – PPO | Source: Ambulatory Visit | Attending: Family | Admitting: Family

## 2022-01-01 VITALS — BP 137/89 | HR 72 | Temp 98.0°F | Resp 20 | Ht 65.0 in | Wt 174.0 lb

## 2022-01-01 DIAGNOSIS — R0789 Other chest pain: Secondary | ICD-10-CM

## 2022-01-01 DIAGNOSIS — K219 Gastro-esophageal reflux disease without esophagitis: Secondary | ICD-10-CM

## 2022-01-01 DIAGNOSIS — Z1231 Encounter for screening mammogram for malignant neoplasm of breast: Secondary | ICD-10-CM

## 2022-01-01 MED ORDER — OMEPRAZOLE 20 MG PO CPDR: 20.0000 mg | DELAYED_RELEASE_CAPSULE | Freq: Every day | ORAL | 3 refills | Status: DC

## 2022-01-01 NOTE — Patient Instructions (Signed)

## 2022-01-01 NOTE — Progress Notes (Signed)
   Subjective:    Patient ID: Carla Woodard, female    DOB: February 01, 1971, 51 y.o.   MRN: [id number]   Chief Complaint: Chest Pain (Has gained some weight and very stressed)   Patient in today with c/o chest pain. She has a very stressful job and now is a bad time.  Chest Pain  This is a new problem. The current episode started 1 to 4 weeks ago. The onset quality is sudden. The problem occurs intermittently. The problem has been waxing and waning. The pain is present in the substernal region. The pain is at a severity of 5/10. The pain is moderate. The quality of the pain is described as heavy (almost like sore). The pain does not radiate. Pertinent negatives include no exertional chest pressure, fever, leg pain, lower extremity edema, palpitations or shortness of breath. Associated with: lying down. She has tried nothing for the symptoms.      Review of Systems  Constitutional:  Negative for chills, fatigue and fever.  Respiratory:  Negative for shortness of breath.   Cardiovascular:  Positive for chest pain. Negative for palpitations and leg swelling.       Objective:   Physical Exam Vitals reviewed.  Constitutional:      Appearance: Normal appearance.  Cardiovascular:     Rate and Rhythm: Normal rate and regular rhythm.     Heart sounds: Normal heart sounds.  Pulmonary:     Breath sounds: Normal breath sounds.  Neurological:     General: No focal deficit present.     Mental Status: She is alert and oriented to person, place, and time.  Psychiatric:        Mood and Affect: Mood normal.        Behavior: Behavior normal.     BP 137/89   Pulse 72   Temp 98 F (36.7 C) (Temporal)   Resp 20   Ht '5\' 5"'$  (1.651 m)   Wt 174 lb (78.9 kg)   SpO2 98%   BMI 28.96 kg/m   Carla Nissen, FNP      Assessment & Plan:  Carla Woodard in today with chief complaint of Chest Pain (Has gained some weight and very stressed)   1. Other chest pain Referral to  cardiology due to family history for Calcium score - EKG 12-Lead - Ambulatory referral to Cardiology  2. Gastroesophageal reflux disease without esophagitis Avoid spicy foods Do not eat 2 hours prior to bedtime  - omeprazole (PRILOSEC) 20 MG capsule; Take 1 capsule (20 mg total) by mouth daily.  Dispense: 30 capsule; Refill: 3    The above assessment and management plan was discussed with the patient. The patient verbalized understanding of and has agreed to the management plan. Patient is aware to call the clinic if symptoms persist or worsen. Patient is aware when to return to the clinic for a follow-up visit. Patient educated on when it is appropriate to go to the emergency department.   Mary-Margaret Hassell Done, FNP

## 2022-01-20 ENCOUNTER — Encounter: Payer: Self-pay | Admitting: Cardiovascular Disease

## 2022-01-20 ENCOUNTER — Ambulatory Visit: Payer: BC Managed Care – PPO | Attending: Cardiovascular Disease | Admitting: Cardiovascular Disease

## 2022-01-20 VITALS — BP 132/88 | HR 78 | Ht 65.0 in | Wt 177.2 lb

## 2022-01-20 DIAGNOSIS — R072 Precordial pain: Secondary | ICD-10-CM

## 2022-01-20 DIAGNOSIS — R0789 Other chest pain: Secondary | ICD-10-CM | POA: Diagnosis not present

## 2022-01-20 MED ORDER — METOPROLOL TARTRATE 100 MG PO TABS: 100.0000 mg | ORAL_TABLET | Freq: Once | ORAL | 0 refills | Status: DC

## 2022-01-20 NOTE — Progress Notes (Signed)
01/20/2022 Carla Woodard   1970-10-12  [id number]  Primary Physician Sharion Balloon, FNP Primary Cardiologist: Lorretta Harp MD [name], [place]  HPI:  Carla Woodard is a 51 y.o. 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  She apparently has been under a lot of stress at work doing multiple jobs and has noticed fairly constant chest pain over the last 3 months.  She has no cardiac risk factors.  She is never smoked.  There is no family history.  She is never had a heart attack or stroke.  She was put on GERD medicine with some improvement in her symptoms.   No outpatient medications have been marked as taking for the 01/20/22 encounter (Office Visit) with Lorretta Harp, MD.     Allergies  Allergen Reactions   Latex Rash    Social History   Socioeconomic History   Marital status: Married    Spouse name: Not on file   Number of children: Not on file   Years of education: Not on file   Highest education level: Not on file  Occupational History   Not on file  Tobacco Use   Smoking status: Never   Smokeless tobacco: Never  Vaping Use   Vaping Use: Never used  Substance and Sexual Activity   Alcohol use: Yes    Alcohol/week: 1.0 standard drink of alcohol    Types: 1 Standard drinks or equivalent per week   Drug use: No   Sexual activity: Yes    Partners: Male    Birth control/protection: I.U.D.  Other Topics Concern   Not on file  Social History Narrative   Not on file   Social Determinants of Health   Financial Resource Strain: Not on file  Food Insecurity: Not on file  Transportation Needs: Not on file  Physical Activity: Not on file  Stress: Not on file  Social Connections: Not on file  Intimate Partner Violence: Not on file     Review of Systems: General: negative for chills, fever, night sweats or weight changes.   Cardiovascular: negative for chest pain, dyspnea on exertion, edema, orthopnea, palpitations, paroxysmal nocturnal dyspnea or shortness of breath Dermatological: negative for rash Respiratory: negative for cough or wheezing Urologic: negative for hematuria Abdominal: negative for nausea, vomiting, diarrhea, bright red blood per rectum, melena, or hematemesis Neurologic: negative for visual changes, syncope, or dizziness All other systems reviewed and are otherwise negative except as noted above.    Blood pressure 132/88, pulse 78, height '5\' 5"'$  (1.651 m), weight 177 lb 3.2 oz (80.4 kg), SpO2 100 %.  General appearance: alert and no distress Neck: no adenopathy, no carotid bruit, no JVD, supple, symmetrical, trachea midline, and thyroid not enlarged, symmetric, no tenderness/mass/nodules Lungs: clear to auscultation bilaterally Heart: regular rate and rhythm, S1, S2 normal, no murmur, click, rub or gallop Extremities: extremities normal, atraumatic, no cyanosis or edema Pulses: 2+ and symmetric Skin: Skin color, texture, turgor normal. No rashes or lesions Neurologic: Grossly normal  EKG sinus rhythm at 78 with septal Q waves.  I personally reviewed this EKG.  ASSESSMENT AND PLAN:   Atypical chest pain Ms. Millay was referred to me by Ronnald Collum for evaluation of atypical chest pain.  She apparently is under a lot of stress at work for the last several months.  She had almost constant pain since  June the radiates to her left upper extremity.  She really has no cardiac risk factors.  She has gained over 20 pounds in that time.  And has noticed hypertension at work.  I am going to get a coronary CTA to further evaluate.     Lorretta Harp MD FACP,FACC,FAHA, [facility] 01/20/2022 9:52 AM

## 2022-01-20 NOTE — Patient Instructions (Signed)
Medication Instructions:  Your physician recommends that you continue on your current medications as directed. Please refer to the Current Medication list given to you today.  *If you need a refill on your cardiac medications before your next appointment, please call your pharmacy*   Lab Work: Your physician recommends that you have labs drawn today: BMET  If you have labs (blood work) drawn today and your tests are completely normal, you will receive your results only by: [place] (if you have MyChart) OR A paper copy in the mail If you have any lab test that is abnormal or we need to change your treatment, we will call you to review the results.   Testing/Procedures: See below   Follow-Up: At [facility], you and your health needs are our priority.  As part of our continuing mission to provide you with exceptional heart care, we have created designated Provider Care Teams.  These Care Teams include your primary Cardiologist (physician) and Advanced Practice Providers (APPs -  Physician Assistants and Nurse Practitioners) who all work together to provide you with the care you need, when you need it.  We recommend signing up for the patient portal called "MyChart".  Sign up information is provided on this After Visit Summary.  MyChart is used to connect with patients for Virtual Visits (Telemedicine).  Patients are able to view lab/test results, encounter notes, upcoming appointments, etc.  Non-urgent messages can be sent to your provider as well.   To learn more about what you can do with MyChart, go to [email or web address].    Your next appointment:   3 month(s)  The format for your next appointment:   In Person  Provider:   Quay Burow, MD    Other Instructions   Your cardiac CT will be scheduled at the below location:   [facility] [address] [place], [place] [address] [phone]   If scheduled at [facility], please  arrive at the [facility] and Children's Entrance (Entrance C2) of [facility] 30 minutes prior to test start time. You can use the FREE valet parking offered at entrance C (encouraged to control the heart rate for the test)  Proceed to the [facility] Radiology Department (first floor) to check-in and test prep.  All radiology patients and guests should use entrance C2 at [facility], accessed from [facility], even though the hospital's physical address listed is [address].      Please follow these instructions carefully (unless otherwise directed):   On the Night Before the Test: Be sure to Drink plenty of water. Do not consume any caffeinated/decaffeinated beverages or chocolate 12 hours prior to your test. Do not take any antihistamines 12 hours prior to your test.   On the Day of the Test: Drink plenty of water until 1 hour prior to the test. You may take your regular medications prior to the test.  Take metoprolol (Lopressor) two hours prior to test. HOLD Furosemide/Hydrochlorothiazide morning of the test. FEMALES- please wear underwire-free bra if available, avoid dresses & tight clothing       After the Test: Drink plenty of water. After receiving IV contrast, you may experience a mild flushed feeling. This is normal. On occasion, you may experience a mild rash up to 24 hours after the test. This is not dangerous. If this occurs, you can take Benadryl 25 mg and increase your fluid intake. If you experience trouble breathing, this can be  serious. If it is severe call 911 IMMEDIATELY. If it is mild, please call our office. If you take any of these medications: Glipizide/Metformin, Avandament, Glucavance, please do not take 48 hours after completing test unless otherwise instructed.  We will call to schedule your test 2-4 weeks out understanding that some insurance companies will need an authorization prior to the service being performed.    For non-scheduling related questions, please contact the cardiac imaging nurse navigator should you have any questions/concerns: Marchia Bond, Cardiac Imaging Nurse Navigator Gordy Clement, Cardiac Imaging Nurse Navigator [facility] Direct Office Dial: [phone]   For scheduling needs, including cancellations and rescheduling, please call [place], [phone].   Heart-Healthy Eating Plan  Eating a healthy diet is important for the health of your heart. A heart-healthy eating plan includes: Eating less unhealthy fats. Eating more healthy fats. Eating less salt in your food. Salt is also called sodium. Making other changes in your diet.  What are tips for following this plan? Cooking Avoid frying your food. Try to bake, boil, grill, or broil it instead. You can also reduce fat by: Removing the skin from poultry. Removing all visible fats from meats. Steaming vegetables in water or broth. Meal planning  At meals, divide your plate into four equal parts: Fill one-half of your plate with vegetables and green salads. Fill one-fourth of your plate with whole grains. Fill one-fourth of your plate with lean protein foods. Eat 2-4 cups of vegetables per day. One cup of vegetables is: 1 cup (91 g) broccoli or cauliflower florets. 2 medium carrots. 1 large bell pepper. 1 large sweet potato. 1 large tomato. 1 medium white potato. 2 cups (150 g) raw leafy greens. Eat 1-2 cups of fruit per day. One cup of fruit is: 1 small apple 1 large banana 1 cup (237 g) mixed fruit, 1 large orange,  cup (82 g) dried fruit, 1 cup (240 mL) 100% fruit juice. Eat more foods that have soluble fiber. These are apples, broccoli, carrots, beans, peas, and barley. Try to get 20-30 g of fiber per day. Eat 4-5 servings of nuts, legumes, and seeds per week: 1 serving of dried beans or legumes equals  cup (90 g) cooked. 1 serving of nuts is  oz (12 almonds, 24  pistachios, or 7 walnut halves). 1 serving of seeds equals  oz (8 g). General information Eat more home-cooked food. Eat less restaurant, buffet, and fast food. Limit or avoid alcohol. Limit foods that are high in starch and sugar. Avoid fried foods. Lose weight if you are overweight. Keep track of how much salt (sodium) you eat. This is important if you have high blood pressure. Ask your doctor to tell you more about this. Try to add vegetarian meals each week. Fats Choose healthy fats. These include olive oil and canola oil, flaxseeds, walnuts, almonds, and seeds. Eat more omega-3 fats. These include salmon, mackerel, sardines, tuna, flaxseed oil, and ground flaxseeds. Try to eat fish at least 2 times each week. Check food labels. Avoid foods with trans fats or high amounts of saturated fat. Limit saturated fats. These are often found in animal products, such as meats, butter, and cream. These are also found in plant foods, such as palm oil, palm kernel oil, and coconut oil. Avoid foods with partially hydrogenated oils in them. These have trans fats. Examples are stick margarine, some tub margarines, cookies, crackers, and other baked goods. What foods should I eat? Fruits All fresh, canned (in  natural juice), or frozen fruits. Vegetables Fresh or frozen vegetables (raw, steamed, roasted, or grilled). Green salads. Grains Most grains. Choose whole wheat and whole grains most of the time. Rice and pasta, including brown rice and pastas made with whole wheat. Meats and other proteins Lean, well-trimmed beef, veal, pork, and lamb. Chicken and [place] without skin. All fish and shellfish. Wild duck, rabbit, pheasant, and venison. Egg whites or low-cholesterol egg substitutes. Dried beans, peas, lentils, and tofu. Seeds and most nuts. Dairy Low-fat or nonfat cheeses, including ricotta and mozzarella. Skim or 1% milk that is liquid, powdered, or evaporated. Buttermilk that is made with  low-fat milk. Nonfat or low-fat yogurt. Fats and oils Non-hydrogenated (trans-free) margarines. Vegetable oils, including soybean, sesame, sunflower, olive, peanut, safflower, corn, canola, and cottonseed. Salad dressings or mayonnaise made with a vegetable oil. Beverages Mineral water. Coffee and tea. Diet carbonated beverages. Sweets and desserts Sherbet, gelatin, and fruit ice. Small amounts of dark chocolate. Limit all sweets and desserts. Seasonings and condiments All seasonings and condiments. The items listed above may not be a complete list of foods and drinks you can eat. Contact a dietitian for more options. What foods should I avoid? Fruits Canned fruit in heavy syrup. Fruit in cream or butter sauce. Fried fruit. Limit coconut. Vegetables Vegetables cooked in cheese, cream, or butter sauce. Fried vegetables. Grains Breads that are made with saturated or trans fats, oils, or whole milk. Croissants. Sweet rolls. Donuts. High-fat crackers, such as cheese crackers. Meats and other proteins Fatty meats, such as hot dogs, ribs, sausage, bacon, rib-eye roast or steak. High-fat deli meats, such as salami and bologna. Caviar. Domestic duck and goose. Organ meats, such as liver. Dairy Cream, sour cream, cream cheese, and creamed cottage cheese. Whole-milk cheeses. Whole or 2% milk that is liquid, evaporated, or condensed. Whole buttermilk. Cream sauce or high-fat cheese sauce. Yogurt that is made from whole milk. Fats and oils Meat fat, or shortening. Cocoa butter, hydrogenated oils, palm oil, coconut oil, palm kernel oil. Solid fats and shortenings, including bacon fat, salt pork, lard, and butter. Nondairy cream substitutes. Salad dressings with cheese or sour cream. Beverages Regular sodas and juice drinks with added sugar. Sweets and desserts Frosting. Pudding. Cookies. Cakes. Pies. Milk chocolate or white chocolate. Buttered syrups. Full-fat ice cream or ice cream drinks. The items  listed above may not be a complete list of foods and drinks to avoid. Contact a dietitian for more information. Summary Heart-healthy meal planning includes eating less unhealthy fats, eating more healthy fats, and making other changes in your diet. Eat a balanced diet. This includes fruits and vegetables, low-fat or nonfat dairy, lean protein, nuts and legumes, whole grains, and heart-healthy oils and fats. This information is not intended to replace advice given to you by your health care provider. Make sure you discuss any questions you have with your health care provider. Document Revised: 06/01/2021 Document Reviewed: 06/01/2021 Elsevier Patient Education  [place].

## 2022-01-20 NOTE — Assessment & Plan Note (Signed)
Carla Woodard was referred to me by Ronnald Collum for evaluation of atypical chest pain.  She apparently is under a lot of stress at work for the last several months.  She had almost constant pain since June the radiates to her left upper extremity.  She really has no cardiac risk factors.  She has gained over 20 pounds in that time.  And has noticed hypertension at work.  I am going to get a coronary CTA to further evaluate.

## 2022-01-21 LAB — BASIC METABOLIC PANEL
BUN/Creatinine Ratio: 23 (ref 9–23)
BUN: 19 mg/dL (ref 6–24)
CO2: 27 mmol/L (ref 20–29)
Calcium: 9.9 mg/dL (ref 8.7–10.2)
Chloride: 101 mmol/L (ref 96–106)
Creatinine, Ser: 0.82 mg/dL (ref 0.57–1.00)
Glucose: 94 mg/dL (ref 70–99)
Potassium: 4.7 mmol/L (ref 3.5–5.2)
Sodium: 141 mmol/L (ref 134–144)
eGFR: 85 mL/min/{1.73_m2} (ref >59)

## 2022-03-04 ENCOUNTER — Encounter (HOSPITAL_COMMUNITY): Payer: Self-pay

## 2022-03-25 ENCOUNTER — Encounter (HOSPITAL_COMMUNITY): Payer: Self-pay

## 2022-03-29 ENCOUNTER — Other Ambulatory Visit: Payer: Self-pay | Admitting: Nurse Practitioner

## 2022-03-29 DIAGNOSIS — K219 Gastro-esophageal reflux disease without esophagitis: Secondary | ICD-10-CM

## 2022-04-05 NOTE — Progress Notes (Signed)
51 y.o. G26P1001 Married White or Caucasian Not Hispanic or Latino female here for annual exam.  She has a mirena IUD, placed in 12/17. No vaginal bleeding. Sexually active, no pain. Mild and tolerable vasomotor symptoms.   No bowel or bladder concerns. Some baseline issues with constipation.   Pt wants to discuss sleeping medications that needs to be listed to travel related. She travels for 5-6 weeks a year and needs ambien to sleep when she travels (only).   She has been under increase in stress at work. Had some chest pain in 8/23 and 9/23, negative evaluation. She has started exercising again, feeling better.   H/O bilateral thyroid nodules, recent u/s, no f/u needed.     No LMP recorded. (Menstrual status: IUD).          Sexually active: Yes.    The current method of family planning is IUD.    Mirena IUD inserted 12/17   Exercising: Yes.    Gym/ health club routine includes Zumba and personal trainer. Smoker:  no  Health Maintenance: Pap:   03-17-18 neg HPV HR neg; 08-20-14 neg HPV HR neg  History of abnormal Pap:  yes Cryo surgery in her 20's  MMG:  01/05/22 density B Bi-rads 1 neg  BMD:   n/a Colonoscopy: she will schedule with her primary  TDaP:  2017  Gardasil: n/a   reports that she has never smoked. She has never used smokeless tobacco. She reports current alcohol use of about 1.0 standard drink of alcohol per week. She reports that she does not use drugs. 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 Her daughter and almost 32 year old grandson are local.   Past Medical History:  Diagnosis Date   Abnormal Pap smear    Hypertension    not currently on meds    Past Surgical History:  Procedure Laterality Date   CERVIX LESION DESTRUCTION     ?   DILATATION & CURETTAGE/HYSTEROSCOPY WITH MYOSURE N/A 04/21/2015   Procedure: DILATATION & CURETTAGE/HYSTEROSCOPY WITH MYOSURE;  Surgeon: [name], MD;  Location: [facility];  Service:  Gynecology;  Laterality: N/A;   EYE SURGERY     INTRAUTERINE DEVICE (IUD) INSERTION  04/2016   VULVA SURGERY  07/20/10   bilateral revision labia minora    Current Outpatient Medications  Medication Sig Dispense Refill   COLLAGEN PO Take by mouth.     levonorgestrel (MIRENA) 20 MCG/24HR IUD 1 each by Intrauterine route once.     Multiple Vitamins-Minerals (MULTIVITAMIN PO) Take 1 tablet by mouth daily.     omeprazole (PRILOSEC) 20 MG capsule TAKE 1 CAPSULE BY MOUTH EVERY DAY 90 capsule 0   zolpidem (AMBIEN) 10 MG tablet One tablet at bedtime for insomnia related to travel 30 tablet 0   Coenzyme Q10 (CO Q 10 PO) Take by mouth. (Patient not taking: Reported on 01/01/2022)     ibuprofen (ADVIL,MOTRIN) 200 MG tablet Take 400 mg by mouth every 6 (six) hours as needed for moderate pain. (Patient not taking: Reported on 01/01/2022)     Ibuprofen-diphenhydrAMINE Cit (ADVIL PM PO) Take by mouth as needed. (Patient not taking: Reported on 01/01/2022)     No current facility-administered medications for this visit.    Family History  Problem Relation Age of Onset   Heart disease Mother    Cirrhosis Mother    Aneurysm Father    Diabetes Maternal Aunt    Heart disease Maternal Grandmother  pacemaker   Stroke Maternal Grandmother    Lung cancer Paternal Grandfather    Thyroid disease Neg Hx    Breast cancer Neg Hx     Review of Systems  All other systems reviewed and are negative.   Exam:   BP (!) 136/94 (BP Location: Right Arm, Patient Position: Sitting, Cuff Size: Normal)   Pulse 84   Ht 5' 4.5" (1.638 m)   Wt 164 lb (74.4 kg)   SpO2 93%   BMI 27.72 kg/m   Weight change: '@WEIGHTCHANGE'$ @ Height:   Height: 5' 4.5" (163.8 cm)  Ht Readings from Last 3 Encounters:  04/12/22 5' 4.5" (1.638 m)  01/20/22 '5\' 5"'$  (1.651 m)  01/01/22 '5\' 5"'$  (1.651 m)    General appearance: alert, cooperative and appears stated age Head: Normocephalic, without obvious abnormality, atraumatic Neck: no  adenopathy, supple, symmetrical, trachea midline and thyroid normal to inspection and palpation Lungs: clear to auscultation bilaterally Cardiovascular: regular rate and rhythm Breasts: normal appearance, no masses or tenderness Abdomen: soft, non-tender; non distended,  no masses,  no organomegaly Extremities: extremities normal, atraumatic, no cyanosis or edema Skin: Skin color, texture, turgor normal. No rashes or lesions Lymph nodes: Cervical, supraclavicular, and axillary nodes normal. No abnormal inguinal nodes palpated Neurologic: Grossly normal   Pelvic: External genitalia:  no lesions              Urethra:  normal appearing urethra with no masses, tenderness or lesions              Bartholins and Skenes: normal                 Vagina: normal appearing vagina with normal color and discharge, no lesions              Cervix: no lesions and IUD strings 3-4 cm               Bimanual Exam:  Uterus:  normal size, contour, position, consistency, mobility, non-tender              Adnexa: no mass, fullness, tenderness               Rectovaginal: Confirms               Anus:  normal sphincter tone, no lesions  Gae Dry, CMA chaperoned for the exam.  1. Well woman exam Discussed breast self exam Discussed calcium and vit D intake Mammogram UTD She will do a colonoscopy with her primary Labs with primary  2. IUD check up Doing well  3. Other insomnia Only taking ambien when she travels - zolpidem (AMBIEN) 10 MG tablet; One tablet at bedtime for insomnia related to travel  Dispense: 30 tablet; Refill: 0  4. Elevated BP without diagnosis of hypertension Repeat BP 150/90 She will f/u with her primary

## 2022-04-12 ENCOUNTER — Ambulatory Visit (INDEPENDENT_AMBULATORY_CARE_PROVIDER_SITE_OTHER): Payer: BC Managed Care – PPO | Admitting: Obstetrics and Gynecology

## 2022-04-12 ENCOUNTER — Encounter: Payer: Self-pay | Admitting: Obstetrics and Gynecology

## 2022-04-12 VITALS — BP 150/90 | HR 84 | Ht 64.5 in | Wt 164.0 lb

## 2022-04-12 DIAGNOSIS — R030 Elevated blood-pressure reading, without diagnosis of hypertension: Secondary | ICD-10-CM | POA: Diagnosis not present

## 2022-04-12 DIAGNOSIS — Z01419 Encounter for gynecological examination (general) (routine) without abnormal findings: Secondary | ICD-10-CM | POA: Diagnosis not present

## 2022-04-12 DIAGNOSIS — G4709 Other insomnia: Secondary | ICD-10-CM

## 2022-04-12 DIAGNOSIS — Z30431 Encounter for routine checking of intrauterine contraceptive device: Secondary | ICD-10-CM

## 2022-04-12 MED ORDER — ZOLPIDEM TARTRATE 10 MG PO TABS: ORAL_TABLET | ORAL | 0 refills | Status: DC

## 2022-04-12 NOTE — Patient Instructions (Signed)

## 2022-04-16 ENCOUNTER — Ambulatory Visit (INDEPENDENT_AMBULATORY_CARE_PROVIDER_SITE_OTHER): Payer: BC Managed Care – PPO | Admitting: Nurse Practitioner

## 2022-04-16 ENCOUNTER — Encounter: Payer: Self-pay | Admitting: Nurse Practitioner

## 2022-04-16 VITALS — BP 137/93 | HR 66 | Temp 97.9°F | Resp 20 | Ht 64.0 in | Wt 170.0 lb

## 2022-04-16 DIAGNOSIS — Z0130 Encounter for examination of blood pressure without abnormal findings: Secondary | ICD-10-CM | POA: Diagnosis not present

## 2022-04-16 NOTE — Patient Instructions (Signed)
Hypertension, Adult High blood pressure (hypertension) is when the force of blood pumping through the arteries is too strong. The arteries are the blood vessels that carry blood from the heart throughout the body. Hypertension forces the heart to work harder to pump blood and may cause arteries to become narrow or stiff. Untreated or uncontrolled hypertension can lead to a heart attack, heart failure, a stroke, kidney disease, and other problems. A blood pressure reading consists of a higher number over a lower number. Ideally, your blood pressure should be below 120/80. The first ("top") number is called the systolic pressure. It is a measure of the pressure in your arteries as your heart beats. The second ("bottom") number is called the diastolic pressure. It is a measure of the pressure in your arteries as the heart relaxes. What are the causes? The exact cause of this condition is not known. There are some conditions that result in high blood pressure. What increases the risk? Certain factors may make you more likely to develop high blood pressure. Some of these risk factors are under your control, including: Smoking. Not getting enough exercise or physical activity. Being overweight. Having too much fat, sugar, calories, or salt (sodium) in your diet. Drinking too much alcohol. Other risk factors include: Having a personal history of heart disease, diabetes, high cholesterol, or kidney disease. Stress. Having a family history of high blood pressure and high cholesterol. Having obstructive sleep apnea. Age. The risk increases with age. What are the signs or symptoms? High blood pressure may not cause symptoms. Very high blood pressure (hypertensive crisis) may cause: Headache. Fast or irregular heartbeats (palpitations). Shortness of breath. Nosebleed. Nausea and vomiting. Vision changes. Severe chest pain, dizziness, and seizures. How is this diagnosed? This condition is diagnosed by  measuring your blood pressure while you are seated, with your arm resting on a flat surface, your legs uncrossed, and your feet flat on the floor. The cuff of the blood pressure monitor will be placed directly against the skin of your upper arm at the level of your heart. Blood pressure should be measured at least twice using the same arm. Certain conditions can cause a difference in blood pressure between your right and left arms. If you have a high blood pressure reading during one visit or you have normal blood pressure with other risk factors, you may be asked to: Return on a different day to have your blood pressure checked again. Monitor your blood pressure at home for 1 week or longer. If you are diagnosed with hypertension, you may have other blood or imaging tests to help your health care provider understand your overall risk for other conditions. How is this treated? This condition is treated by making healthy lifestyle changes, such as eating healthy foods, exercising more, and reducing your alcohol intake. You may be referred for counseling on a healthy diet and physical activity. Your health care provider may prescribe medicine if lifestyle changes are not enough to get your blood pressure under control and if: Your systolic blood pressure is above 130. Your diastolic blood pressure is above 80. Your personal target blood pressure may vary depending on your medical conditions, your age, and other factors. Follow these instructions at home: Eating and drinking  Eat a diet that is high in fiber and potassium, and low in sodium, added sugar, and fat. An example of this eating plan is called the DASH diet. DASH stands for Dietary Approaches to Stop Hypertension. To eat this way: Eat   plenty of fresh fruits and vegetables. Try to fill one half of your plate at each meal with fruits and vegetables. Eat whole grains, such as whole-wheat pasta, brown rice, or whole-grain bread. Fill about one  fourth of your plate with whole grains. Eat or drink low-fat dairy products, such as skim milk or low-fat yogurt. Avoid fatty cuts of meat, processed or cured meats, and poultry with skin. Fill about one fourth of your plate with lean proteins, such as fish, chicken without skin, beans, eggs, or tofu. Avoid pre-made and processed foods. These tend to be higher in sodium, added sugar, and fat. Reduce your daily sodium intake. Many people with hypertension should eat less than 1,500 mg of sodium a day. Do not drink alcohol if: Your health care provider tells you not to drink. You are pregnant, may be pregnant, or are planning to become pregnant. If you drink alcohol: Limit how much you have to: 0-1 drink a day for women. 0-2 drinks a day for men. Know how much alcohol is in your drink. In the [place] one drink equals one 12 oz bottle of beer (355 mL), one 5 oz glass of wine (148 mL), or one 1 oz glass of hard liquor (44 mL). Lifestyle  Work with your health care provider to maintain a healthy body weight or to lose weight. Ask what an ideal weight is for you. Get at least 30 minutes of exercise that causes your heart to beat faster (aerobic exercise) most days of the week. Activities may include walking, swimming, or biking. Include exercise to strengthen your muscles (resistance exercise), such as Pilates or lifting weights, as part of your weekly exercise routine. Try to do these types of exercises for 30 minutes at least 3 days a week. Do not use any products that contain nicotine or tobacco. These products include cigarettes, chewing tobacco, and vaping devices, such as e-cigarettes. If you need help quitting, ask your health care provider. Monitor your blood pressure at home as told by your health care provider. Keep all follow-up visits. This is important. Medicines Take over-the-counter and prescription medicines only as told by your health care provider. Follow directions carefully. Blood  pressure medicines must be taken as prescribed. Do not skip doses of blood pressure medicine. Doing this puts you at risk for problems and can make the medicine less effective. Ask your health care provider about side effects or reactions to medicines that you should watch for. Contact a health care provider if you: Think you are having a reaction to a medicine you are taking. Have headaches that keep coming back (recurring). Feel dizzy. Have swelling in your ankles. Have trouble with your vision. Get help right away if you: Develop a severe headache or confusion. Have unusual weakness or numbness. Feel faint. Have severe pain in your chest or abdomen. Vomit repeatedly. Have trouble breathing. These symptoms may be an emergency. Get help right away. Call 911. Do not wait to see if the symptoms will go away. Do not drive yourself to the hospital. Summary Hypertension is when the force of blood pumping through your arteries is too strong. If this condition is not controlled, it may put you at risk for serious complications. Your personal target blood pressure may vary depending on your medical conditions, your age, and other factors. For most people, a normal blood pressure is less than 120/80. Hypertension is treated with lifestyle changes, medicines, or a combination of both. Lifestyle changes include losing weight, eating a healthy,   low-sodium diet, exercising more, and limiting alcohol. This information is not intended to replace advice given to you by your health care provider. Make sure you discuss any questions you have with your health care provider. Document Revised: 03/03/2021 Document Reviewed: 03/03/2021 Elsevier Patient Education  2023 Elsevier Inc.  

## 2022-04-16 NOTE — Progress Notes (Signed)
   Subjective:    Patient ID: Carla Woodard, female    DOB: Jun 11, 1970, 51 y.o.   MRN: [id number]   Chief Complaint: hypertension  HPI Patient  had GYN appoitmnevt on Monday. Blood pressure was 130/78. She has been checking it at home and it is running 993'T systolic. No chest pain, sob or headache.     Review of Systems  Constitutional:  Negative for diaphoresis.  Eyes:  Negative for pain.  Respiratory:  Negative for shortness of breath.   Cardiovascular:  Negative for chest pain, palpitations and leg swelling.  Gastrointestinal:  Negative for abdominal pain.  Endocrine: Negative for polydipsia.  Skin:  Negative for rash.  Neurological:  Negative for dizziness, weakness and headaches.  Hematological:  Does not bruise/bleed easily.  All other systems reviewed and are negative.      Objective:   Physical Exam Vitals reviewed.  Constitutional:      Appearance: Normal appearance.  Cardiovascular:     Rate and Rhythm: Normal rate and regular rhythm.     Heart sounds: Normal heart sounds.  Pulmonary:     Effort: Pulmonary effort is normal.     Breath sounds: Normal breath sounds.  Skin:    General: Skin is warm.  Neurological:     General: No focal deficit present.     Mental Status: She is alert and oriented to person, place, and time.  Psychiatric:        Mood and Affect: Mood normal.        Behavior: Behavior normal.    BP (!) 137/93   Pulse 66   Temp 97.9 F (36.6 C) (Temporal)   Resp 20   Ht '5\' 4"'$  (1.626 m)   Wt 170 lb (77.1 kg)   SpO2 98%   BMI 29.18 kg/m    122/86     Assessment & Plan:   Carla Woodard in today with chief complaint of No chief complaint on file.   1. Blood pressure check Blood pressure normal range Low sodium diet COntinue  to keep check of blood pressure daily at home     The above assessment and management plan was discussed with the patient. The patient verbalized understanding of and has agreed to the management  plan. Patient is aware to call the clinic if symptoms persist or worsen. Patient is aware when to return to the clinic for a follow-up visit. Patient educated on when it is appropriate to go to the emergency department.   Mary-Margaret Hassell Done, FNP

## 2022-04-27 ENCOUNTER — Ambulatory Visit: Payer: BC Managed Care – PPO | Admitting: Cardiovascular Disease

## 2023-01-14 ENCOUNTER — Other Ambulatory Visit: Payer: Self-pay | Admitting: Family

## 2023-01-14 DIAGNOSIS — Z1231 Encounter for screening mammogram for malignant neoplasm of breast: Secondary | ICD-10-CM

## 2023-01-17 ENCOUNTER — Inpatient Hospital Stay: Admission: RE | Admit: 2023-01-17 | Payer: BC Managed Care – PPO | Source: Ambulatory Visit

## 2023-02-14 ENCOUNTER — Ambulatory Visit
Admission: RE | Admit: 2023-02-14 | Discharge: 2023-02-14 | Disposition: A | Discharge status: Home / Self Care | Payer: BC Managed Care – PPO | Source: Ambulatory Visit | Attending: Family | Admitting: Family

## 2023-02-14 DIAGNOSIS — Z1231 Encounter for screening mammogram for malignant neoplasm of breast: Secondary | ICD-10-CM

## 2023-02-15 ENCOUNTER — Other Ambulatory Visit (HOSPITAL_COMMUNITY): Payer: Self-pay

## 2023-04-18 ENCOUNTER — Ambulatory Visit: Payer: BC Managed Care – PPO | Admitting: Obstetrics and Gynecology

## 2023-04-28 ENCOUNTER — Encounter: Payer: Self-pay | Admitting: Obstetrics and Gynecology

## 2023-04-28 ENCOUNTER — Ambulatory Visit (INDEPENDENT_AMBULATORY_CARE_PROVIDER_SITE_OTHER): Payer: BC Managed Care – PPO | Admitting: Obstetrics and Gynecology

## 2023-04-28 ENCOUNTER — Other Ambulatory Visit (HOSPITAL_COMMUNITY)
Admission: RE | Admit: 2023-04-28 | Discharge: 2023-04-28 | Disposition: A | Discharge status: Home / Self Care | Payer: BC Managed Care – PPO | Source: Ambulatory Visit | Attending: Obstetrics and Gynecology | Admitting: Obstetrics and Gynecology

## 2023-04-28 VITALS — BP 124/76 | HR 79 | Ht 64.25 in | Wt 180.0 lb

## 2023-04-28 DIAGNOSIS — Z01419 Encounter for gynecological examination (general) (routine) without abnormal findings: Secondary | ICD-10-CM | POA: Diagnosis present

## 2023-04-28 DIAGNOSIS — Z23 Encounter for immunization: Secondary | ICD-10-CM | POA: Diagnosis not present

## 2023-04-28 DIAGNOSIS — Z1211 Encounter for screening for malignant neoplasm of colon: Secondary | ICD-10-CM

## 2023-04-28 NOTE — Progress Notes (Signed)
52 y.o. y.o. female here for annual exam. No LMP recorded. (Menstrual status: IUD).     No LMP recorded. (Menstrual status: IUD).          Sexually active: Yes.    The current method of family planning is IUD.    Mirena IUD inserted 12/17  remove next December Exercising: Yes every day.    Gym/ health club routine includes Zumba and personal trainer. Smoker:  no No Hot flashes or VB.  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  Husband works for [employer] .  Is hard for her to schedule colonoscopy and for husband to take a day off to take her. Tried the cologuard but they could not read the results  Health Maintenance: Pap:   03-17-18 neg HPV HR neg; 08-20-14 neg HPV HR neg  History of abnormal Pap:  yes Cryo surgery in her 20's  MMG:  01/05/22 density B Bi-rads 1 neg  BMD:   n/a Colonoscopy: referral placed for both.  Counseled on importance for colon cancer screening. TDaP:  2017  Gardasil: n/a     04/28/2023    7:58 AM 01/10/2015    7:58 AM  Depression screen PHQ 2/9  Decreased Interest 0 0  Down, Depressed, Hopeless 0 0  PHQ - 2 Score 0 0    Blood pressure 124/76, pulse 79, height 5' 4.25" (1.632 m), weight 180 lb (81.6 kg), SpO2 97%.     Component Value Date/Time   DIAGPAP  03/17/2018 0000    NEGATIVE FOR INTRAEPITHELIAL LESIONS OR MALIGNANCY.   ADEQPAP  03/17/2018 0000    Satisfactory for evaluation  endocervical/transformation zone component PRESENT.    GYN HISTORY:    Component Value Date/Time   DIAGPAP  03/17/2018 0000    NEGATIVE FOR INTRAEPITHELIAL LESIONS OR MALIGNANCY.   ADEQPAP  03/17/2018 0000    Satisfactory for evaluation  endocervical/transformation zone component PRESENT.    OB History  Gravida Para Term Preterm AB Living  1 1 1   1   SAB IAB Ectopic Multiple Live Births      1    # Outcome Date GA Lbr Len/2nd Weight Sex Type Anes PTL Lv  1 Term     F Vag-Spont   LIV    Past Medical History:  Diagnosis Date  . Abnormal Pap  smear   . Hypertension    not currently on meds    Past Surgical History:  Procedure Laterality Date  . CERVIX LESION DESTRUCTION     ?  Marland Kitchen DILATATION & CURETTAGE/HYSTEROSCOPY WITH MYOSURE N/A 04/21/2015   Procedure: DILATATION & CURETTAGE/HYSTEROSCOPY WITH MYOSURE;  Surgeon: Romualdo Bolk, MD;  Location: [facility];  Service: Gynecology;  Laterality: N/A;  . EYE SURGERY    . INTRAUTERINE DEVICE (IUD) INSERTION  04/2016  . VULVA SURGERY  07/20/10   bilateral revision labia minora    Current Outpatient Medications on File Prior to Visit  Medication Sig Dispense Refill  . COLLAGEN PO Take by mouth.    Marland Kitchen ibuprofen (ADVIL,MOTRIN) 200 MG tablet Take 400 mg by mouth every 6 (six) hours as needed for moderate pain.    . Ibuprofen-diphenhydrAMINE Cit (ADVIL PM PO) Take by mouth as needed.    Marland Kitchen levonorgestrel (MIRENA) 20 MCG/24HR IUD 1 each by Intrauterine route once.    . Multiple Vitamins-Minerals (MULTIVITAMIN PO) Take 1 tablet by mouth daily.    Marland Kitchen zolpidem (AMBIEN) 10 MG tablet One tablet at bedtime for insomnia related  to travel (Patient not taking: Reported on 04/28/2023) 30 tablet 0   No current facility-administered medications on file prior to visit.    Social History   Socioeconomic History  . Marital status: Married    Spouse name: Not on file  . Number of children: Not on file  . Years of education: Not on file  . Highest education level: Not on file  Occupational History  . Not on file  Tobacco Use  . Smoking status: Never  . Smokeless tobacco: Never  Vaping Use  . Vaping status: Never Used  Substance and Sexual Activity  . Alcohol use: Yes    Alcohol/week: 1.0 standard drink of alcohol    Types: 1 Standard drinks or equivalent per week  . Drug use: No  . Sexual activity: Yes    Partners: Male    Birth control/protection: I.U.D.  Other Topics Concern  . Not on file  Social History Narrative  . Not on file   Social Drivers of Health   Financial Resource  Strain: Not on file  Food Insecurity: Not on file  Transportation Needs: Not on file  Physical Activity: Not on file  Stress: Not on file  Social Connections: Unknown (09/20/2021)   Received from [facility], [facility]   Social Network   . Social Network: Not on file  Intimate Partner Violence: Unknown (08/12/2021)   [facility] from [facility], [facility]   HITS   . Physically Hurt: Not on file   . Insult or Talk Down To: Not on file   . Threaten Physical Harm: Not on file   . Scream or Curse: Not on file    Family History  Problem Relation Age of Onset  . Heart disease Mother   . Cirrhosis Mother   . Aneurysm Father   . Diabetes Maternal Aunt   . Heart disease Maternal Grandmother        pacemaker  . Stroke Maternal Grandmother   . Lung cancer Paternal Grandfather   . Thyroid disease Neg Hx   . Breast cancer Neg Hx      Allergies  Allergen Reactions  . Latex Rash      Patient's last menstrual period was No LMP recorded. (Menstrual status: IUD)..            Review of Systems Alls systems reviewed and are negative.     Physical Exam Constitutional:      Appearance: Normal appearance.  Genitourinary:     Vulva and urethral meatus normal.     No lesions in the vagina.     Right Labia: No rash, lesions or skin changes.    Left Labia: No lesions, skin changes or rash.    No vaginal discharge or tenderness.     No vaginal prolapse present.    No vaginal atrophy present.     Right Adnexa: not tender, not palpable and no mass present.    Left Adnexa: not tender, not palpable and no mass present.    No cervical motion tenderness or discharge.     IUD strings visualized.     Uterus is not enlarged, tender or irregular.  Breasts:    Right: Normal.     Left: Normal.  HENT:     Head: Normocephalic.     Comments: B/l thyroid nodules Neck:     Thyroid: No thyroid mass, thyromegaly or thyroid tenderness.  Cardiovascular:     Rate and Rhythm: Normal rate  and regular rhythm.  Heart sounds: Normal heart sounds, S1 normal and S2 normal.  Pulmonary:     Effort: Pulmonary effort is normal.     Breath sounds: Normal breath sounds and air entry.  Abdominal:     General: There is no distension.     Palpations: Abdomen is soft. There is no mass.     Tenderness: There is no abdominal tenderness. There is no guarding or rebound.  Musculoskeletal:        General: Normal range of motion.     Cervical back: Full passive range of motion without pain, normal range of motion and neck supple. No tenderness.     Right lower leg: No edema.     Left lower leg: No edema.  Neurological:     Mental Status: She is alert.  Skin:    General: Skin is warm.  Psychiatric:        Mood and Affect: Mood normal.        Behavior: Behavior normal.        Thought Content: Thought content normal.  Vitals and nursing note reviewed. Exam conducted with a chaperone present.      A:         Well Woman GYN exam                             P:        Pap smear collected today Encouraged annual mammogram screening Colon cancer screening referral placed today with both incase she can get a ride for the colonoscopy.  If not, has referral for the cologuard DXA not indicated Labs and immunizations ordered today Encouraged healthy lifestyle practices Encouraged Vit D and Calcium   No follow-ups on file.  Earley Favor

## 2023-04-29 LAB — LIPID PANEL
Cholesterol: 187 mg/dL (ref <200)
HDL: 77 mg/dL (ref >=50)
LDL Cholesterol (Calc): 86 mg/dL
Non-HDL Cholesterol (Calc): 110 mg/dL (ref <130)
Total CHOL/HDL Ratio: 2.4 (calc) (ref <5.0)
Triglycerides: 143 mg/dL (ref <150)

## 2023-04-29 LAB — COMPREHENSIVE METABOLIC PANEL
AG Ratio: 2.1 (calc) (ref 1.0–2.5)
ALT: 20 U/L (ref 6–29)
AST: 22 U/L (ref 10–35)
Albumin: 4.8 g/dL (ref 3.6–5.1)
Alkaline phosphatase (APISO): 75 U/L (ref 37–153)
BUN: 12 mg/dL (ref 7–25)
CO2: 28 mmol/L (ref 20–32)
Calcium: 9.4 mg/dL (ref 8.6–10.4)
Chloride: 102 mmol/L (ref 98–110)
Creat: 0.8 mg/dL (ref 0.50–1.03)
Globulin: 2.3 g/dL (ref 1.9–3.7)
Glucose, Bld: 95 mg/dL (ref 65–99)
Potassium: 4.7 mmol/L (ref 3.5–5.3)
Sodium: 140 mmol/L (ref 135–146)
Total Bilirubin: 0.5 mg/dL (ref 0.2–1.2)
Total Protein: 7.1 g/dL (ref 6.1–8.1)

## 2023-04-29 LAB — CBC
HCT: 45.9 % — ABNORMAL HIGH (ref 35.0–45.0)
Hemoglobin: 15.1 g/dL (ref 11.7–15.5)
MCH: 31.5 pg (ref 27.0–33.0)
MCHC: 32.9 g/dL (ref 32.0–36.0)
MCV: 95.8 fL (ref 80.0–100.0)
MPV: 10.1 fL (ref 7.5–12.5)
Platelets: 393 10*3/uL (ref 140–400)
RBC: 4.79 10*6/uL (ref 3.80–5.10)
RDW: 11.8 % (ref 11.0–15.0)
WBC: 7.5 10*3/uL (ref 3.8–10.8)

## 2023-04-29 LAB — TSH: TSH: 1.1 m[IU]/L

## 2023-04-29 LAB — HEMOGLOBIN A1C
Hgb A1c MFr Bld: 5.4 %{Hb} (ref <5.7)
Mean Plasma Glucose: 108 mg/dL
eAG (mmol/L): 6 mmol/L

## 2023-04-29 LAB — VITAMIN D 25 HYDROXY (VIT D DEFICIENCY, FRACTURES): Vit D, 25-Hydroxy: 46 ng/mL (ref 30–100)

## 2023-04-29 LAB — FOLLICLE STIMULATING HORMONE: FSH: 100.9 m[IU]/mL

## 2023-04-29 LAB — ESTRADIOL: Estradiol: 22 pg/mL

## 2023-05-03 LAB — CYTOLOGY - PAP
Comment: NEGATIVE
Diagnosis: NEGATIVE
High risk HPV: NEGATIVE

## 2023-06-19 ENCOUNTER — Encounter: Payer: Self-pay | Admitting: Obstetrics and Gynecology

## 2023-06-19 LAB — COLOGUARD: COLOGUARD: NEGATIVE

## 2023-09-27 ENCOUNTER — Telehealth: Payer: Self-pay

## 2023-09-27 NOTE — Telephone Encounter (Signed)
 Please call patient and make an appointment with someone

## 2023-09-27 NOTE — Telephone Encounter (Signed)
 Copied from CRM [id number]. Topic: Clinical - Medication Question >> Sep 27, 2023  8:00 AM Carlatta H wrote: Reason for CRM: Patient got exposed to poison ivy this weekend and would like a prescription called in //Please call [phone] to advise once done//

## 2023-09-28 NOTE — Telephone Encounter (Signed)
 Pt aware she ntbs. She is treating with otc.

## 2023-12-12 ENCOUNTER — Ambulatory Visit: Payer: Self-pay

## 2023-12-12 NOTE — Telephone Encounter (Signed)
Appt scheduled 8/7

## 2023-12-12 NOTE — Telephone Encounter (Signed)
 FYI Only or Action Required?: FYI only for provider.  Patient was last seen in primary care on 04/16/2022 by Gladis Mustard, FNP.  Called Nurse Triage reporting Chest Pain/Stress/headache.  Symptoms began March .  Interventions attempted: Other: vigorous exercise .  Symptoms are: gradually worsening.  Triage Disposition: See Physician Within 24 Hours  Patient/caregiver understands and will follow disposition?: yes         Copied from CRM #[id number]. Topic: Clinical - Red Word Triage >> Dec 12, 2023  8:34 AM Graeme ORN wrote: Red Word that prompted transfer to Nurse Triage: Headaches constant, chest tightness, stress. Possibly bp - not able to take it at home.    ----------------------------------------------------------------------- From previous Reason for Contact - Scheduling: Patient/patient representative is calling to schedule an appointment. Refer to attachments for appointment information. Reason for Disposition  [1] Chest pain lasts > 5 minutes AND [2] occurred > 3 days ago (72 hours) AND [3] NO chest pain or cardiac symptoms now  Answer Assessment - Initial Assessment Questions 1. LOCATION: Where does it hurt?       Chest tightness 2. RADIATION: Does the pain go anywhere else? (e.g., into neck, jaw, arms, back)     No thinks she grinds  3. ONSET: When did the chest pain begin? (Minutes, hours or days)      March  4. PATTERN: Does the pain come and go, or has it been constant since it started?  Does it get worse with exertion?      Comes and goes //no  5. DURATION: How long does it last (e.g., seconds, minutes, hours)     Seconds  6. SEVERITY: How bad is the pain?  (e.g., Scale 1-10; mild, moderate, or severe)     5/10 7. CARDIAC RISK FACTORS: Do you have any history of heart problems or risk factors for heart disease? (e.g., angina, prior heart attack; diabetes, high blood pressure, high cholesterol, smoker, or strong family history of heart  disease)     no 8. PULMONARY RISK FACTORS: Do you have any history of lung disease?  (e.g., blood clots in lung, asthma, emphysema, birth control pills)     no 9. CAUSE: What do you think is causing the chest pain?     Stress//pt stated she feels stress in chest and has h/o chest tightness with stress//possibly HTN 10. OTHER SYMPTOMS: Do you have any other symptoms? (e.g., dizziness, nausea, vomiting, sweating, fever, difficulty breathing, cough)       Headaches/ holds her stress  Protocols used: Chest Pain-A-AH

## 2023-12-15 ENCOUNTER — Ambulatory Visit: Payer: Self-pay | Admitting: Family Medicine

## 2023-12-22 ENCOUNTER — Ambulatory Visit: Admitting: Family

## 2023-12-22 ENCOUNTER — Encounter: Payer: Self-pay | Admitting: Family

## 2023-12-22 VITALS — BP 149/94 | HR 83 | Temp 98.1°F | Ht 64.25 in | Wt 183.0 lb

## 2023-12-22 DIAGNOSIS — F411 Generalized anxiety disorder: Secondary | ICD-10-CM | POA: Diagnosis not present

## 2023-12-22 DIAGNOSIS — Z0001 Encounter for general adult medical examination with abnormal findings: Secondary | ICD-10-CM

## 2023-12-22 DIAGNOSIS — I10 Essential (primary) hypertension: Secondary | ICD-10-CM | POA: Diagnosis not present

## 2023-12-22 DIAGNOSIS — Z0000 Encounter for general adult medical examination without abnormal findings: Secondary | ICD-10-CM

## 2023-12-22 LAB — CMP14+EGFR
ALT: 22 IU/L (ref 0–32)
AST: 25 IU/L (ref 0–40)
Albumin: 4.7 g/dL (ref 3.8–4.9)
Alkaline Phosphatase: 91 IU/L (ref 44–121)
BUN/Creatinine Ratio: 15 (ref 9–23)
BUN: 12 mg/dL (ref 6–24)
Bilirubin Total: 0.4 mg/dL (ref 0.0–1.2)
CO2: 21 mmol/L (ref 20–29)
Calcium: 9.8 mg/dL (ref 8.7–10.2)
Chloride: 100 mmol/L (ref 96–106)
Creatinine, Ser: 0.81 mg/dL (ref 0.57–1.00)
Globulin, Total: 2.2 g/dL (ref 1.5–4.5)
Glucose: 92 mg/dL (ref 70–99)
Potassium: 4.6 mmol/L (ref 3.5–5.2)
Sodium: 138 mmol/L (ref 134–144)
Total Protein: 6.9 g/dL (ref 6.0–8.5)
eGFR: 86 mL/min/1.73 (ref >59)

## 2023-12-22 LAB — TSH: TSH: 0.782 u[IU]/mL (ref 0.450–4.500)

## 2023-12-22 LAB — CBC WITH DIFFERENTIAL/PLATELET
Basophils Absolute: 0.1 x10E3/uL (ref 0.0–0.2)
Basos: 1 %
EOS (ABSOLUTE): 0.1 x10E3/uL (ref 0.0–0.4)
Eos: 2 %
Hematocrit: 44.2 % (ref 34.0–46.6)
Hemoglobin: 15.1 g/dL (ref 11.1–15.9)
Immature Grans (Abs): 0 x10E3/uL (ref 0.0–0.1)
Immature Granulocytes: 0 %
Lymphocytes Absolute: 2.4 x10E3/uL (ref 0.7–3.1)
Lymphs: 32 %
MCH: 32.6 pg (ref 26.6–33.0)
MCHC: 34.2 g/dL (ref 31.5–35.7)
MCV: 96 fL (ref 79–97)
Monocytes Absolute: 0.6 x10E3/uL (ref 0.1–0.9)
Monocytes: 8 %
Neutrophils Absolute: 4.3 x10E3/uL (ref 1.4–7.0)
Neutrophils: 57 %
Platelets: 390 x10E3/uL (ref 150–450)
RBC: 4.63 x10E6/uL (ref 3.77–5.28)
RDW: 12.2 % (ref 11.7–15.4)
WBC: 7.6 x10E3/uL (ref 3.4–10.8)

## 2023-12-22 LAB — LIPID PANEL
Chol/HDL Ratio: 2.7 ratio (ref 0.0–4.4)
Cholesterol, Total: 187 mg/dL (ref 100–199)
HDL: 69 mg/dL (ref >39)
LDL Chol Calc (NIH): 101 mg/dL — ABNORMAL HIGH (ref 0–99)
Triglycerides: 95 mg/dL (ref 0–149)
VLDL Cholesterol Cal: 17 mg/dL (ref 5–40)

## 2023-12-22 MED ORDER — HYDROCHLOROTHIAZIDE 25 MG PO TABS: 25.0000 mg | ORAL_TABLET | Freq: Every day | ORAL | 3 refills | Status: AC

## 2023-12-22 MED ORDER — ESCITALOPRAM OXALATE 10 MG PO TABS
10.0000 mg | ORAL_TABLET | Freq: Every day | ORAL | 5 refills | Status: DC
Start: 2023-12-22 — End: 2024-01-13

## 2023-12-22 MED ORDER — BUSPIRONE HCL 5 MG PO TABS: 5.0000 mg | ORAL_TABLET | Freq: Three times a day (TID) | ORAL | 2 refills | Status: DC | PRN

## 2023-12-22 NOTE — Patient Instructions (Signed)
 Hypertension, Adult High blood pressure (hypertension) is when the force of blood pumping through the arteries is too strong. The arteries are the blood vessels that carry blood from the heart throughout the body. Hypertension forces the heart to work harder to pump blood and may cause arteries to become narrow or stiff. Untreated or uncontrolled hypertension can lead to a heart attack, heart failure, a stroke, kidney disease, and other problems. A blood pressure reading consists of a higher number over a lower number. Ideally, your blood pressure should be below 120/80. The first ("top") number is called the systolic pressure. It is a measure of the pressure in your arteries as your heart beats. The second ("bottom") number is called the diastolic pressure. It is a measure of the pressure in your arteries as the heart relaxes. What are the causes? The exact cause of this condition is not known. There are some conditions that result in high blood pressure. What increases the risk? Certain factors may make you more likely to develop high blood pressure. Some of these risk factors are under your control, including: Smoking. Not getting enough exercise or physical activity. Being overweight. Having too much fat, sugar, calories, or salt (sodium) in your diet. Drinking too much alcohol. Other risk factors include: Having a personal history of heart disease, diabetes, high cholesterol, or kidney disease. Stress. Having a family history of high blood pressure and high cholesterol. Having obstructive sleep apnea. Age. The risk increases with age. What are the signs or symptoms? High blood pressure may not cause symptoms. Very high blood pressure (hypertensive crisis) may cause: Headache. Fast or irregular heartbeats (palpitations). Shortness of breath. Nosebleed. Nausea and vomiting. Vision changes. Severe chest pain, dizziness, and seizures. How is this diagnosed? This condition is diagnosed by  measuring your blood pressure while you are seated, with your arm resting on a flat surface, your legs uncrossed, and your feet flat on the floor. The cuff of the blood pressure monitor will be placed directly against the skin of your upper arm at the level of your heart. Blood pressure should be measured at least twice using the same arm. Certain conditions can cause a difference in blood pressure between your right and left arms. If you have a high blood pressure reading during one visit or you have normal blood pressure with other risk factors, you may be asked to: Return on a different day to have your blood pressure checked again. Monitor your blood pressure at home for 1 week or longer. If you are diagnosed with hypertension, you may have other blood or imaging tests to help your health care provider understand your overall risk for other conditions. How is this treated? This condition is treated by making healthy lifestyle changes, such as eating healthy foods, exercising more, and reducing your alcohol intake. You may be referred for counseling on a healthy diet and physical activity. Your health care provider may prescribe medicine if lifestyle changes are not enough to get your blood pressure under control and if: Your systolic blood pressure is above 130. Your diastolic blood pressure is above 80. Your personal target blood pressure may vary depending on your medical conditions, your age, and other factors. Follow these instructions at home: Eating and drinking  Eat a diet that is high in fiber and potassium, and low in sodium, added sugar, and fat. An example of this eating plan is called the DASH diet. DASH stands for Dietary Approaches to Stop Hypertension. To eat this way: Eat  plenty of fresh fruits and vegetables. Try to fill one half of your plate at each meal with fruits and vegetables. Eat whole grains, such as whole-wheat pasta, brown rice, or whole-grain bread. Fill about one  fourth of your plate with whole grains. Eat or drink low-fat dairy products, such as skim milk or low-fat yogurt. Avoid fatty cuts of meat, processed or cured meats, and poultry with skin. Fill about one fourth of your plate with lean proteins, such as fish, chicken without skin, beans, eggs, or tofu. Avoid pre-made and processed foods. These tend to be higher in sodium, added sugar, and fat. Reduce your daily sodium intake. Many people with hypertension should eat less than 1,500 mg of sodium a day. Do not drink alcohol if: Your health care provider tells you not to drink. You are pregnant, may be pregnant, or are planning to become pregnant. If you drink alcohol: Limit how much you have to: 0-1 drink a day for women. 0-2 drinks a day for men. Know how much alcohol is in your drink. In the [place] one drink equals one 12 oz bottle of beer (355 mL), one 5 oz glass of wine (148 mL), or one 1 oz glass of hard liquor (44 mL). Lifestyle  Work with your health care provider to maintain a healthy body weight or to lose weight. Ask what an ideal weight is for you. Get at least 30 minutes of exercise that causes your heart to beat faster (aerobic exercise) most days of the week. Activities may include walking, swimming, or biking. Include exercise to strengthen your muscles (resistance exercise), such as Pilates or lifting weights, as part of your weekly exercise routine. Try to do these types of exercises for 30 minutes at least 3 days a week. Do not use any products that contain nicotine or tobacco. These products include cigarettes, chewing tobacco, and vaping devices, such as e-cigarettes. If you need help quitting, ask your health care provider. Monitor your blood pressure at home as told by your health care provider. Keep all follow-up visits. This is important. Medicines Take over-the-counter and prescription medicines only as told by your health care provider. Follow directions carefully. Blood  pressure medicines must be taken as prescribed. Do not skip doses of blood pressure medicine. Doing this puts you at risk for problems and can make the medicine less effective. Ask your health care provider about side effects or reactions to medicines that you should watch for. Contact a health care provider if you: Think you are having a reaction to a medicine you are taking. Have headaches that keep coming back (recurring). Feel dizzy. Have swelling in your ankles. Have trouble with your vision. Get help right away if you: Develop a severe headache or confusion. Have unusual weakness or numbness. Feel faint. Have severe pain in your chest or abdomen. Vomit repeatedly. Have trouble breathing. These symptoms may be an emergency. Get help right away. Call 911. Do not wait to see if the symptoms will go away. Do not drive yourself to the hospital. Summary Hypertension is when the force of blood pumping through your arteries is too strong. If this condition is not controlled, it may put you at risk for serious complications. Your personal target blood pressure may vary depending on your medical conditions, your age, and other factors. For most people, a normal blood pressure is less than 120/80. Hypertension is treated with lifestyle changes, medicines, or a combination of both. Lifestyle changes include losing weight, eating a healthy,  low-sodium diet, exercising more, and limiting alcohol. This information is not intended to replace advice given to you by your health care provider. Make sure you discuss any questions you have with your health care provider. Document Revised: 03/03/2021 Document Reviewed: 03/03/2021 Elsevier Patient Education  2024 [employer].

## 2023-12-22 NOTE — Progress Notes (Signed)
 Subjective:    Patient ID: Carla Woodard, female    DOB: [birth year], 53 y.o.   MRN: [id number]  Chief Complaint  Patient presents with   Stress    A lot going on    PT presents to the office today for CPE and multiple complaints.   She reports a deal of stress right now helping with her daughter and grandchildren.   Her BP is elevated today. States she wakes up with headaches. Has been taking tylenol. 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  She is also complaining of weight gain of 30 lb over the last several years. Continues to exercise 5-7 days for 45-60 mins a day. However, she reports she is stress eating.      12/22/2023   10:09 AM 04/28/2023    7:41 AM 04/16/2022    3:13 PM  Last 3 Weights  Weight (lbs) 183 lb 180 lb 170 lb  Weight (kg) 83.008 kg 81.647 kg 77.111 kg     Hypertension This is a new problem. The current episode started more than 1 year ago. The problem has been waxing and waning since onset. The problem is uncontrolled. Associated symptoms include anxiety, headaches and malaise/fatigue. Pertinent negatives include no peripheral edema or shortness of breath. Risk factors for coronary artery disease include obesity. Past treatments include nothing.      Review of Systems  Constitutional:  Positive for malaise/fatigue.  Respiratory:  Negative for shortness of breath.   Neurological:  Positive for headaches.  All other systems reviewed and are negative.   Social History   Socioeconomic History   Marital status: Married    Spouse name: Not on file   Number of children: Not on file   Years of education: Not on file   Highest education level: 12th grade  Occupational History   Not on file  Tobacco Use   Smoking status: Never   Smokeless tobacco: Never  Vaping Use   Vaping status: Never Used  Substance and Sexual Activity   Alcohol use: Yes    Alcohol/week: 1.0 standard drink of alcohol    Types: 1 Standard drinks or equivalent per week    Drug use: No   Sexual activity: Yes    Partners: Male    Birth control/protection: I.U.D.  Other Topics Concern   Not on file  Social History Narrative   Not on file   Social Drivers of Health   Financial Resource Strain: Low Risk  (12/13/2023)   Overall Financial Resource Strain (CARDIA)    Difficulty of Paying Living Expenses: Not very hard  Food Insecurity: No Food Insecurity (12/13/2023)   Hunger Vital Sign    Worried About Running Out of Food in the Last Year: Never true    Ran Out of Food in the Last Year: Never true  Transportation Needs: No Transportation Needs (12/13/2023)   PRAPARE - Administrator, Civil Service (Medical): No    Lack of Transportation (Non-Medical): No  Physical Activity: Sufficiently Active (12/13/2023)   Exercise Vital Sign    Days of Exercise per Week: 6 days    Minutes of Exercise per Session: 60 min  Stress: Stress Concern Present (12/13/2023)   [employer] of Occupational Health - Occupational Stress Questionnaire    Feeling of Stress: Very much  Social Connections: Socially Integrated (12/13/2023)   Social Connection and Isolation Panel    Frequency of Communication with Friends and Family: Three times a week  Frequency of Social Gatherings with Friends and Family: Once a week    Attends Religious Services: More than 4 times per year    Active Member of [employer] or Organizations: Yes    Attends Engineer, structural: More than 4 times per year    Marital Status: Married   Family History  Problem Relation Age of Onset   Heart disease Mother    Cirrhosis Mother    Aneurysm Father    Diabetes Maternal Aunt    Heart disease Maternal Grandmother        pacemaker   Stroke Maternal Grandmother    Lung cancer Paternal Grandfather    Thyroid disease Neg Hx    Breast cancer Neg Hx         Objective:   Physical Exam Vitals reviewed.  Constitutional:      General: She is not in acute distress.    Appearance: She is  well-developed. She is obese.  HENT:     Head: Normocephalic and atraumatic.     Right Ear: Tympanic membrane normal.     Left Ear: Tympanic membrane normal.  Eyes:     Pupils: Pupils are equal, round, and reactive to light.  Neck:     Thyroid: No thyromegaly.  Cardiovascular:     Rate and Rhythm: Normal rate and regular rhythm.     Heart sounds: Normal heart sounds. No murmur heard. Pulmonary:     Effort: Pulmonary effort is normal. No respiratory distress.     Breath sounds: Normal breath sounds. No wheezing.  Abdominal:     General: Bowel sounds are normal. There is no distension.     Palpations: Abdomen is soft.     Tenderness: There is no abdominal tenderness.  Musculoskeletal:        General: No tenderness. Normal range of motion.     Cervical back: Normal range of motion and neck supple.  Skin:    General: Skin is warm and dry.  Neurological:     Mental Status: She is alert and oriented to person, place, and time.     Cranial Nerves: No cranial nerve deficit.     Deep Tendon Reflexes: Reflexes are normal and symmetric.  Psychiatric:        Behavior: Behavior normal.        Thought Content: Thought content normal.        Judgment: Judgment normal.       BP (!) 149/94   Pulse 83   Temp 98.1 F (36.7 C) (Temporal)   Ht 5' 4.25 (1.632 m)   Wt 183 lb (83 kg)   BMI 31.17 kg/m      Assessment & Plan:  Carla Woodard comes in today with chief complaint of Stress (A lot going on )   Diagnosis and orders addressed:  1. Annual physical exam (Primary) - CMP14+EGFR - CBC with Differential/Platelet - Lipid panel - TSH  2. GAD (generalized anxiety disorder) Will start Lexapro 10 mg and Buspar 5 mg TID prn  Stress management  Follow up in 4-6 weeks  - escitalopram (LEXAPRO) 10 MG tablet; Take 1 tablet (10 mg total) by mouth daily.  Dispense: 30 tablet; Refill: 5 - busPIRone (BUSPAR) 5 MG tablet; Take 1 tablet (5 mg total) by mouth 3 (three) times daily as  needed.  Dispense: 90 tablet; Refill: 2 - CMP14+EGFR - CBC with Differential/Platelet - TSH  3. Essential hypertension Start hydrochlorothiazide 25 mg  -Dash diet information given -Exercise encouraged -  Stress Management  -Continue current meds -RTO in 4 weeks - hydrochlorothiazide (HYDRODIURIL) 25 MG tablet; Take 1 tablet (25 mg total) by mouth daily.  Dispense: 90 tablet; Refill: 3 - CMP14+EGFR - CBC with Differential/Platelet   Labs pending Start Lexapro 10 mg and Buspar  mg TID prn  Hydrochlorothiazide 25 mg  Continue current medications  Keep follow up with specialists  Health Maintenance reviewed Diet and exercise encouraged  Return in about 4 weeks (around 01/19/2024), or if symptoms worsen or fail to improve.    Bari Learn, FNP

## 2023-12-23 ENCOUNTER — Ambulatory Visit: Payer: Self-pay | Admitting: Family

## 2024-01-13 ENCOUNTER — Other Ambulatory Visit: Payer: Self-pay | Admitting: Family

## 2024-01-13 DIAGNOSIS — F411 Generalized anxiety disorder: Secondary | ICD-10-CM

## 2024-01-24 ENCOUNTER — Encounter: Payer: Self-pay | Admitting: Family

## 2024-01-24 ENCOUNTER — Ambulatory Visit: Admitting: Family

## 2024-02-07 ENCOUNTER — Encounter: Payer: Self-pay | Admitting: Family

## 2024-02-07 ENCOUNTER — Ambulatory Visit: Admitting: Family

## 2024-02-07 VITALS — BP 123/78 | HR 81 | Temp 97.4°F | Ht 64.25 in | Wt 186.0 lb

## 2024-02-07 DIAGNOSIS — F419 Anxiety disorder, unspecified: Secondary | ICD-10-CM | POA: Diagnosis not present

## 2024-02-07 DIAGNOSIS — I10 Essential (primary) hypertension: Secondary | ICD-10-CM

## 2024-02-07 DIAGNOSIS — Z23 Encounter for immunization: Secondary | ICD-10-CM | POA: Diagnosis not present

## 2024-02-07 DIAGNOSIS — E66811 Obesity, class 1: Secondary | ICD-10-CM | POA: Diagnosis not present

## 2024-02-07 DIAGNOSIS — Z6831 Body mass index (BMI) 31.0-31.9, adult: Secondary | ICD-10-CM

## 2024-02-07 DIAGNOSIS — Z713 Dietary counseling and surveillance: Secondary | ICD-10-CM | POA: Diagnosis not present

## 2024-02-07 MED ORDER — PHENTERMINE HCL 37.5 MG PO TABS: 37.5000 mg | ORAL_TABLET | Freq: Every day | ORAL | 2 refills | Status: DC

## 2024-02-07 NOTE — Progress Notes (Signed)
 Subjective:    Patient ID: Carla Woodard, female    DOB: 21-Sep-1970, 53 y.o.   MRN: [id number]  Chief Complaint  Patient presents with   Hypertension   PT presents to the office today to recheck HTN and GAD. She was seen on 12/22/23 and we started her on hydrochlorothiazide  25 mg. Her BP is at goal today.   She was also started on Lexapro  10 mg and Buspar  5 mg TID Prn. She reports her anxiety has greatly improved.   Wants to discuss weight loss medication. She reports she 50 mins of exercise 5 days a week  and doing exercise classes 2-3 times a week. She watches her diet, but has gained 30 lbs over the last few years.      02/07/2024    3:25 PM 12/22/2023   10:09 AM 04/28/2023    7:41 AM  Last 3 Weights  Weight (lbs) 186 lb 183 lb 180 lb  Weight (kg) 84.369 kg 83.008 kg 81.647 kg     Hypertension This is a chronic problem. The current episode started more than 1 year ago. The problem has been resolved since onset. The problem is controlled. Associated symptoms include anxiety. Pertinent negatives include no malaise/fatigue, peripheral edema or shortness of breath. Past treatments include diuretics. The current treatment provides moderate improvement.  Anxiety Presents for follow-up visit. Symptoms include excessive worry and nervous/anxious behavior. Patient reports no shortness of breath. Symptoms occur occasionally. The severity of symptoms is mild.        Review of Systems  Constitutional:  Negative for malaise/fatigue.  Respiratory:  Negative for shortness of breath.   Psychiatric/Behavioral:  The patient is nervous/anxious.   All other systems reviewed and are negative.   Social History   Socioeconomic History   Marital status: Married    Spouse name: Not on file   Number of children: Not on file   Years of education: Not on file   Highest education level: 12th grade  Occupational History   Not on file  Tobacco Use   Smoking status: Never   Smokeless  tobacco: Never  Vaping Use   Vaping status: Never Used  Substance and Sexual Activity   Alcohol use: Yes    Alcohol/week: 1.0 standard drink of alcohol    Types: 1 Standard drinks or equivalent per week   Drug use: No   Sexual activity: Yes    Partners: Male    Birth control/protection: I.U.D.  Other Topics Concern   Not on file  Social History Narrative   Not on file   Social Drivers of Health   Financial Resource Strain: Low Risk  (12/13/2023)   Overall Financial Resource Strain (CARDIA)    Difficulty of Paying Living Expenses: Not very hard  Food Insecurity: No Food Insecurity (12/13/2023)   Hunger Vital Sign    Worried About Running Out of Food in the Last Year: Never true    Ran Out of Food in the Last Year: Never true  Transportation Needs: No Transportation Needs (12/13/2023)   PRAPARE - Administrator, Civil Service (Medical): No    Lack of Transportation (Non-Medical): No  Physical Activity: Sufficiently Active (12/13/2023)   Exercise Vital Sign    Days of Exercise per Week: 6 days    Minutes of Exercise per Session: 60 min  Stress: Stress Concern Present (12/13/2023)   [employer] of Occupational Health - Occupational Stress Questionnaire    Feeling of Stress: Very much  Social  Connections: Socially Integrated (12/13/2023)   Social Connection and Isolation Panel    Frequency of Communication with Friends and Family: Three times a week    Frequency of Social Gatherings with Friends and Family: Once a week    Attends Religious Services: More than 4 times per year    Active Member of [employer] or Organizations: Yes    Attends Engineer, structural: More than 4 times per year    Marital Status: Married   Family History  Problem Relation Age of Onset   Heart disease Mother    Cirrhosis Mother    Aneurysm Father    Diabetes Maternal Aunt    Heart disease Maternal Grandmother        pacemaker   Stroke Maternal Grandmother    Lung cancer Paternal  Grandfather    Thyroid  disease Neg Hx    Breast cancer Neg Hx         Objective:   Physical Exam Vitals reviewed.  Constitutional:      General: She is not in acute distress.    Appearance: She is well-developed.  HENT:     Head: Normocephalic and atraumatic.     Right Ear: Tympanic membrane normal.     Left Ear: Tympanic membrane normal.  Eyes:     Pupils: Pupils are equal, round, and reactive to light.  Neck:     Thyroid : No thyromegaly.  Cardiovascular:     Rate and Rhythm: Normal rate and regular rhythm.     Heart sounds: Normal heart sounds. No murmur heard. Pulmonary:     Effort: Pulmonary effort is normal. No respiratory distress.     Breath sounds: Normal breath sounds. No wheezing.  Abdominal:     General: Bowel sounds are normal. There is no distension.     Palpations: Abdomen is soft.     Tenderness: There is no abdominal tenderness.  Musculoskeletal:        General: No tenderness. Normal range of motion.     Cervical back: Normal range of motion and neck supple.  Skin:    General: Skin is warm and dry.  Neurological:     Mental Status: She is alert and oriented to person, place, and time.     Cranial Nerves: No cranial nerve deficit.     Deep Tendon Reflexes: Reflexes are normal and symmetric.  Psychiatric:        Behavior: Behavior normal.        Thought Content: Thought content normal.        Judgment: Judgment normal.       BP 123/78   Pulse 81   Temp (!) 97.4 F (36.3 C) (Temporal)   Ht 5' 4.25 (1.632 m)   Wt 186 lb (84.4 kg)   SpO2 96%   BMI 31.68 kg/m      Assessment & Plan:  Carla Woodard comes in today with chief complaint of Hypertension   Diagnosis and orders addressed:  1. Encounter for immunization - Flu vaccine trivalent PF, 6mos and older(Flulaval,Afluria,Fluarix,Fluzone) - BMP8+EGFR  2. Anxiety Continue Lexapro  and Buspar  Stress management  - BMP8+EGFR  3. Essential hypertension (Primary) At goal  Continue  HCTZ - BMP8+EGFR  4. Weight loss counseling, encounter for Will start phentermine  Encourage healthy diet and exercise Need to lose 5% of weight to continue medication  - phentermine (ADIPEX-P) 37.5 MG tablet; Take 1 tablet (37.5 mg total) by mouth daily before breakfast.  Dispense: 30 tablet; Refill: 2 - BMP8+EGFR  5. Class 1 obesity with serious comorbidity and body mass index (BMI) of 31.0 to 31.9 in adult, unspecified obesity type - phentermine (ADIPEX-P) 37.5 MG tablet; Take 1 tablet (37.5 mg total) by mouth daily before breakfast.  Dispense: 30 tablet; Refill: 2 - BMP8+EGFR   Labs pending Continue current medications  Health Maintenance reviewed Diet and exercise encouraged  Return in about 3 months (around 05/08/2024), or if symptoms worsen or fail to improve.    Bari Learn, FNP

## 2024-02-07 NOTE — Patient Instructions (Signed)

## 2024-02-08 LAB — BMP8+EGFR
BUN/Creatinine Ratio: 17 (ref 9–23)
BUN: 17 mg/dL (ref 6–24)
CO2: 23 mmol/L (ref 20–29)
Calcium: 9.7 mg/dL (ref 8.7–10.2)
Chloride: 97 mmol/L (ref 96–106)
Creatinine, Ser: 1 mg/dL (ref 0.57–1.00)
Glucose: 91 mg/dL (ref 70–99)
Potassium: 4.5 mmol/L (ref 3.5–5.2)
Sodium: 137 mmol/L (ref 134–144)
eGFR: 67 mL/min/1.73 (ref >59)

## 2024-02-09 ENCOUNTER — Ambulatory Visit: Payer: Self-pay | Admitting: Family

## 2024-02-27 ENCOUNTER — Other Ambulatory Visit: Payer: Self-pay | Admitting: Family

## 2024-02-27 DIAGNOSIS — Z1231 Encounter for screening mammogram for malignant neoplasm of breast: Secondary | ICD-10-CM

## 2024-02-29 ENCOUNTER — Ambulatory Visit
Admission: RE | Admit: 2024-02-29 | Discharge: 2024-02-29 | Disposition: A | Discharge status: Home / Self Care | Source: Ambulatory Visit | Attending: Family | Admitting: Family

## 2024-02-29 ENCOUNTER — Encounter: Payer: Self-pay | Admitting: *Deleted

## 2024-02-29 DIAGNOSIS — Z1231 Encounter for screening mammogram for malignant neoplasm of breast: Secondary | ICD-10-CM

## 2024-03-30 IMAGING — US US THYROID
1 series · 13 of 25 positions shown · non-contrast
Comparison: 03/21/2015

CLINICAL DATA: Thyroid nodule follow-up

EXAM:
THYROID ULTRASOUND
TECHNIQUE: Ultrasound examination of the thyroid gland and adjacent soft
tissues was performed.

[Series 1: us thyroid · 67 acquisitions, 13 frames shown]
[im 1/67]
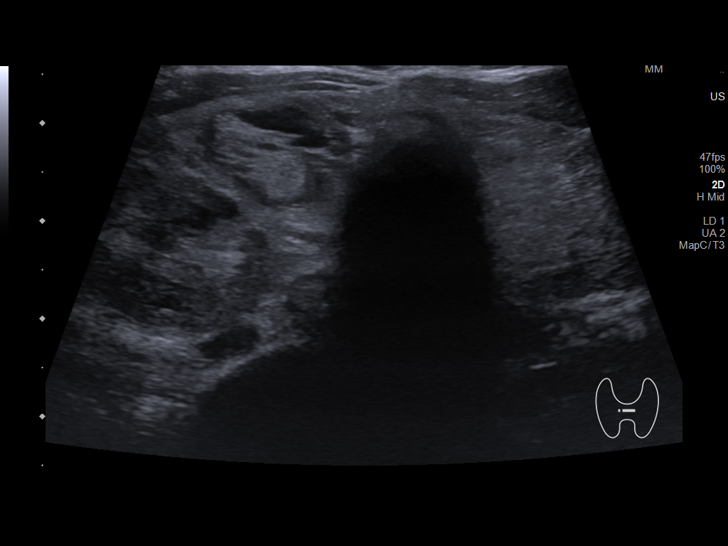
[im 6/67]
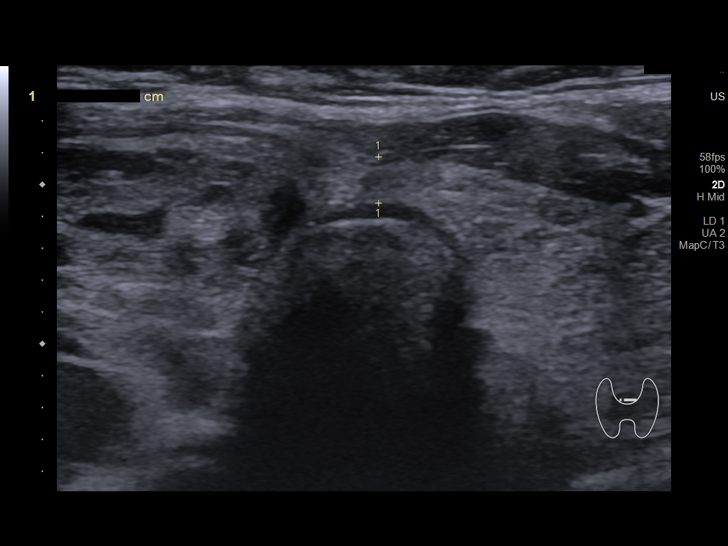
[im 12/67]
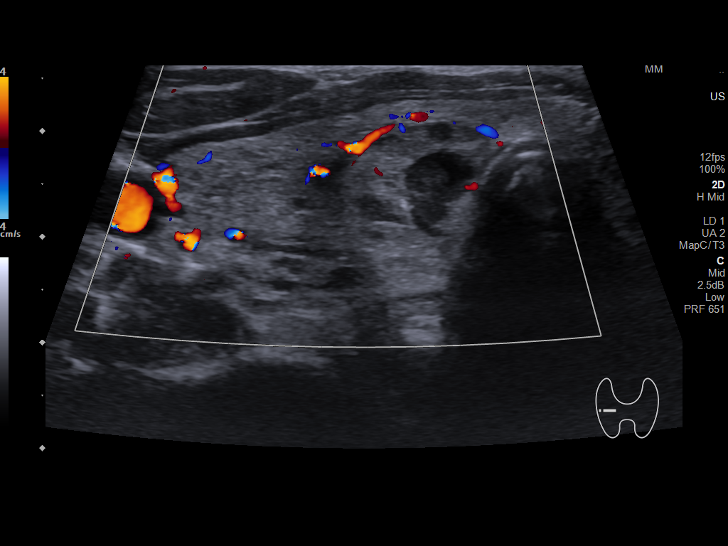
[im 17/67]
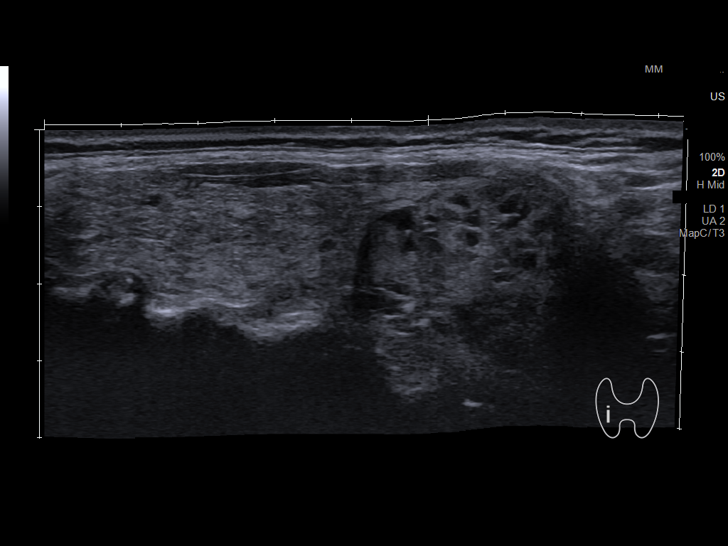
[im 23/67]
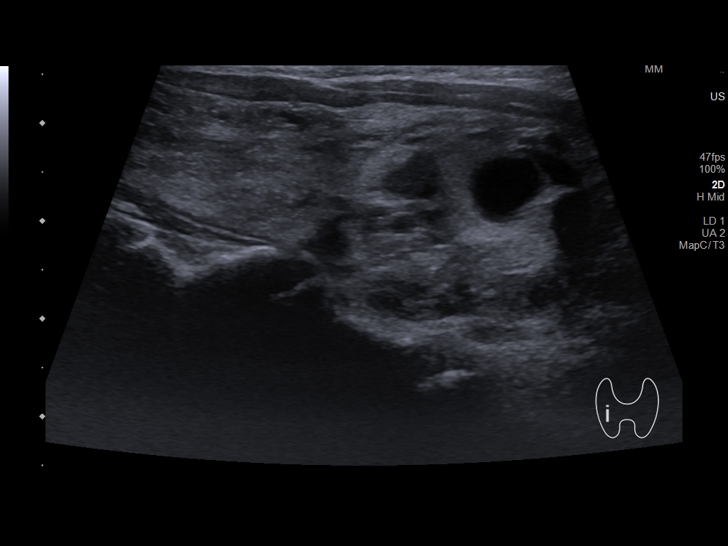
[im 28/67]
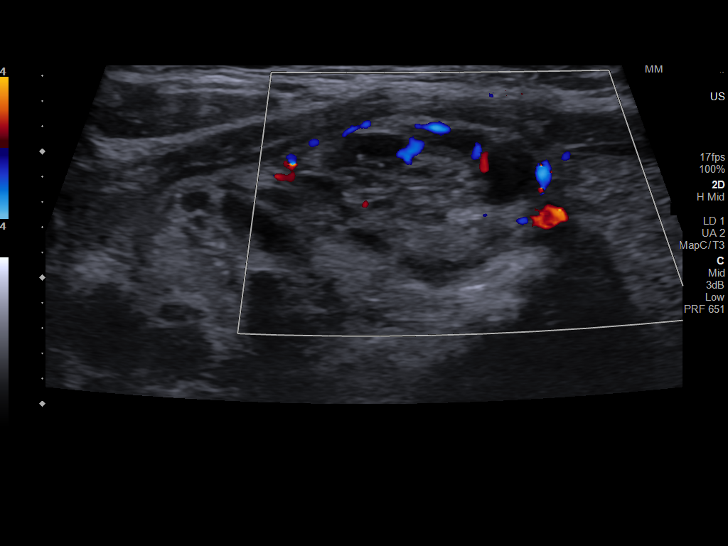
[im 34/67]
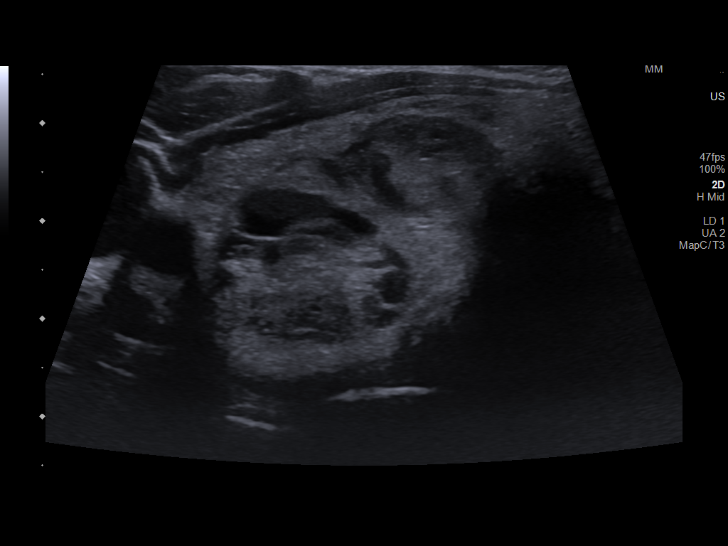
[im 39/67]
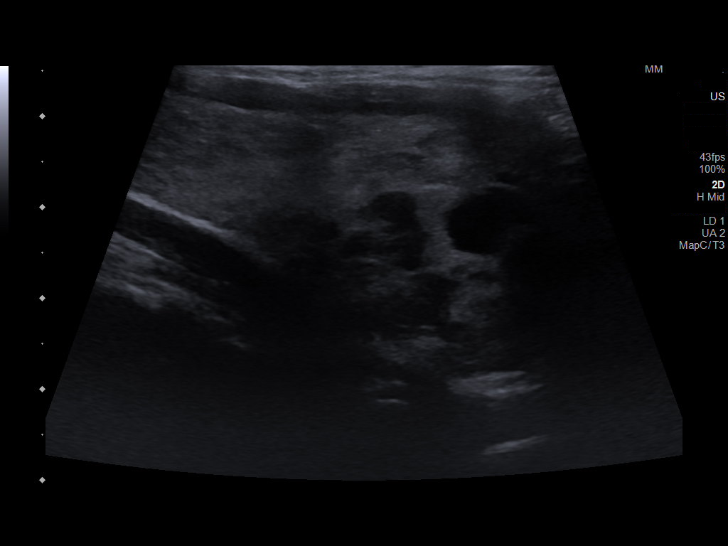
[im 45/67]
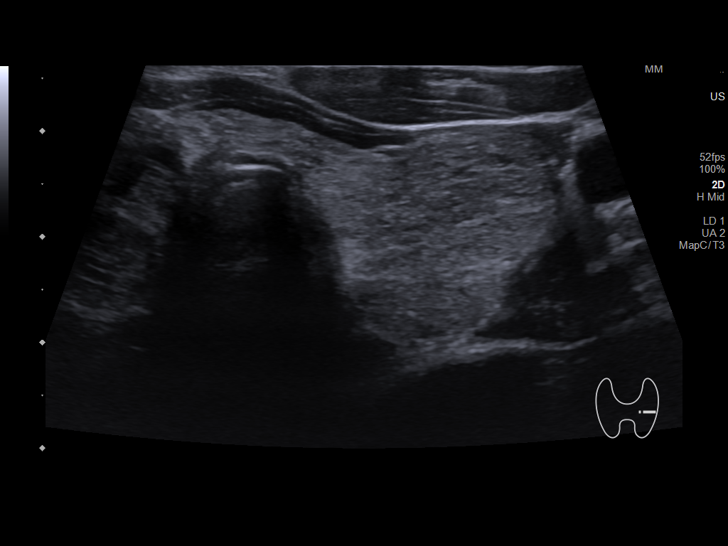
[im 50/67]
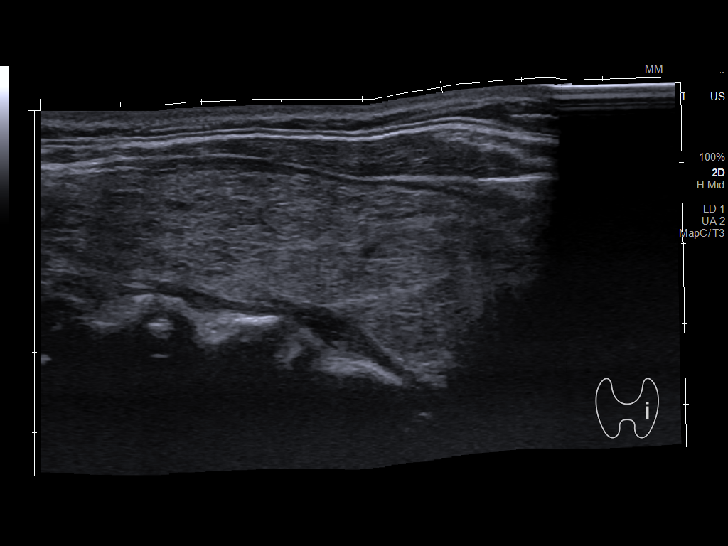
[im 56/67]
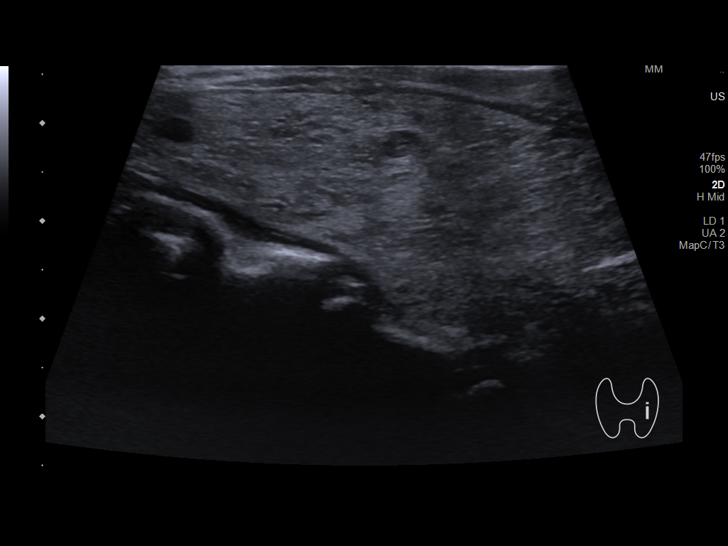
[im 61/67]
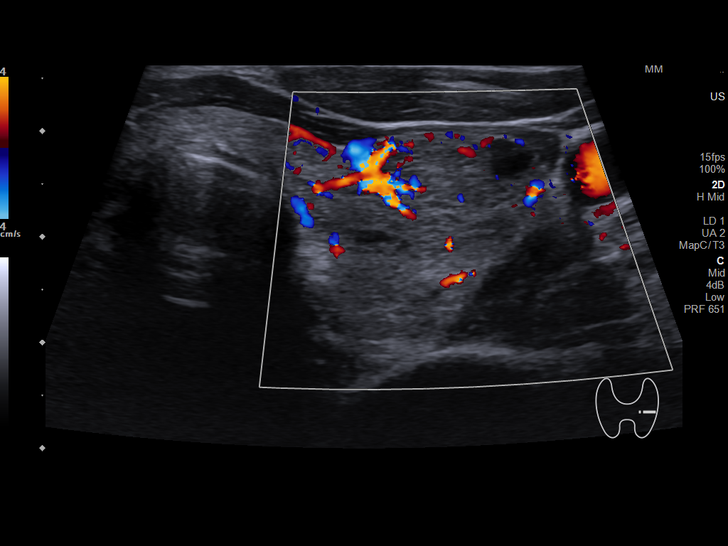
[im 67/67]
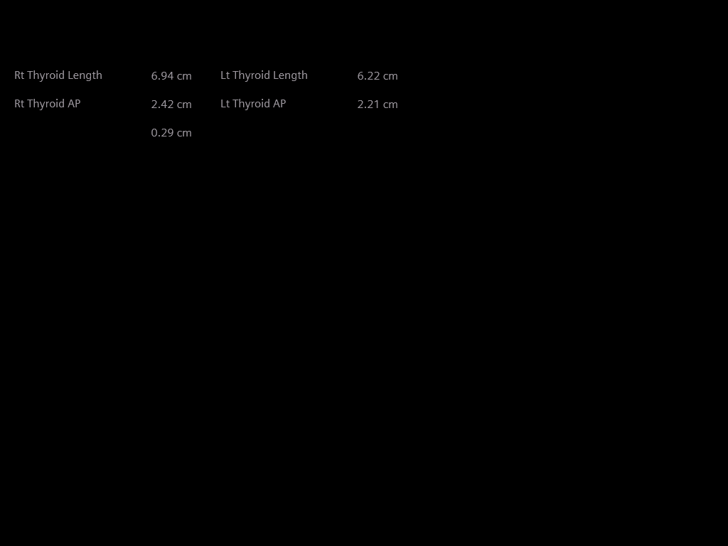

[13 of 25 positions shown; findings below may reference images not displayed]

FINDINGS: Parenchymal Echotexture: Mildly heterogenous

Isthmus: 0.3 cm

Right lobe: 6.9 x 2.4 x 2.8 cm

Left lobe: 6.2 x 2.2 x 2.3 cm

_________________________________________________________

Estimated total number of nodules >/= 1 cm: 2

Number of spongiform nodules >/=  2 cm not described below (TR1): 0

Number of mixed cystic and solid nodules >/= 1.5 cm not described
below (TR2): 0

_________________________________________________________

Nodule 1: 2.6 x 2.0 x 1.4 cm spongiform nodule in the mid right
thyroid lobe is not significantly changed in size since the prior
examination. It does not meet criteria for FNA or imaging follow-up.

_________________________________________________________

Nodule 2: 3.2 x 1.8 x 1.3 cm spongiform nodule in the mid right
thyroid lobe does not meet criteria for imaging surveillance or FNA.

_________________________________________________________

Nodule 3: 0.5 cm cystic nodule in the mid left thyroid lobe does not
meet criteria for imaging surveillance or FNA.
IMPRESSION: Bilateral cystic/spongiform nodules do not meet criteria for FNA or
imaging follow-up.

The above is in keeping with the ACR TI-RADS recommendations - [HOSPITAL] 9776;[DATE].

## 2024-04-13 ENCOUNTER — Other Ambulatory Visit: Payer: Self-pay | Admitting: Family

## 2024-04-13 DIAGNOSIS — F411 Generalized anxiety disorder: Secondary | ICD-10-CM

## 2024-04-30 ENCOUNTER — Ambulatory Visit: Payer: BC Managed Care – PPO | Admitting: Obstetrics and Gynecology

## 2024-05-08 ENCOUNTER — Ambulatory Visit: Admitting: Family

## 2024-05-15 ENCOUNTER — Encounter: Payer: Self-pay | Admitting: Obstetrics and Gynecology

## 2024-05-15 ENCOUNTER — Ambulatory Visit (INDEPENDENT_AMBULATORY_CARE_PROVIDER_SITE_OTHER): Admitting: Obstetrics and Gynecology

## 2024-05-15 ENCOUNTER — Other Ambulatory Visit (HOSPITAL_COMMUNITY)
Admission: RE | Admit: 2024-05-15 | Discharge: 2024-05-15 | Disposition: A | Discharge status: Home / Self Care | Source: Ambulatory Visit | Attending: Obstetrics and Gynecology | Admitting: Obstetrics and Gynecology

## 2024-05-15 ENCOUNTER — Ambulatory Visit: Payer: Self-pay | Admitting: Obstetrics and Gynecology

## 2024-05-15 VITALS — BP 126/78 | HR 87 | Ht 64.0 in | Wt 158.0 lb

## 2024-05-15 DIAGNOSIS — N952 Postmenopausal atrophic vaginitis: Secondary | ICD-10-CM

## 2024-05-15 DIAGNOSIS — Z01419 Encounter for gynecological examination (general) (routine) without abnormal findings: Secondary | ICD-10-CM | POA: Diagnosis not present

## 2024-05-15 DIAGNOSIS — N9419 Other specified dyspareunia: Secondary | ICD-10-CM

## 2024-05-15 DIAGNOSIS — E2839 Other primary ovarian failure: Secondary | ICD-10-CM

## 2024-05-15 DIAGNOSIS — Z1331 Encounter for screening for depression: Secondary | ICD-10-CM

## 2024-05-15 DIAGNOSIS — D229 Melanocytic nevi, unspecified: Secondary | ICD-10-CM

## 2024-05-15 DIAGNOSIS — Z30432 Encounter for removal of intrauterine contraceptive device: Secondary | ICD-10-CM | POA: Diagnosis not present

## 2024-05-15 DIAGNOSIS — Z1211 Encounter for screening for malignant neoplasm of colon: Secondary | ICD-10-CM

## 2024-05-15 LAB — COMPREHENSIVE METABOLIC PANEL WITH GFR
AG Ratio: 2.2 (calc) (ref 1.0–2.5)
ALT: 16 U/L (ref 6–29)
AST: 19 U/L (ref 10–35)
Albumin: 4.8 g/dL (ref 3.6–5.1)
Alkaline phosphatase (APISO): 77 U/L (ref 37–153)
BUN: 13 mg/dL (ref 7–25)
CO2: 30 mmol/L (ref 20–32)
Calcium: 9.9 mg/dL (ref 8.6–10.4)
Chloride: 98 mmol/L (ref 98–110)
Creat: 0.76 mg/dL (ref 0.50–1.03)
Globulin: 2.2 g/dL (ref 1.9–3.7)
Glucose, Bld: 91 mg/dL (ref 65–99)
Potassium: 4.8 mmol/L (ref 3.5–5.3)
Sodium: 135 mmol/L (ref 135–146)
Total Bilirubin: 0.5 mg/dL (ref 0.2–1.2)
Total Protein: 7 g/dL (ref 6.1–8.1)
eGFR: 92 mL/min/1.73m2

## 2024-05-15 LAB — FOLLICLE STIMULATING HORMONE: FSH: 112.4 m[IU]/mL

## 2024-05-15 LAB — ESTRADIOL: Estradiol: <30 pg/mL

## 2024-05-15 LAB — VITAMIN D 25 HYDROXY (VIT D DEFICIENCY, FRACTURES): Vit D, 25-Hydroxy: 65 ng/mL (ref 30–100)

## 2024-05-15 MED ORDER — INTRAROSA 6.5 MG VA INST: 1.0000 | VAGINAL_INSERT | Freq: Every evening | VAGINAL | 12 refills | Status: AC | PRN

## 2024-05-15 NOTE — Progress Notes (Signed)
 "   54 y.o. y.o. female here for annual exam. No LMP recorded. (Menstrual status: IUD).   No LMP recorded. (Menstrual status: IUD).          Sexually active: Yes.    The current method of family planning is IUD.    Mirena  IUD inserted 12/17  IUD removed 10/13/24  Exercising: Yes every day.    Gym/ health club routine includes Zumba and personal trainer. Smoker:  no No Hot flashes or VB.  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  Husband works for [employer].  Is hard for her to schedule colonoscopy and for husband to take a day off to take her. Tried the cologuard but they could not read the results Family history of liver and cardiac disease  Health Maintenance: Pap:   03-17-18 neg HPV HR neg; 08-20-14 neg HPV HR neg  History of abnormal Pap:  yes Cryo surgery in her 20's  MMG: 10/25 BMD:   referral placed Colonoscopy: referral placed for both.  Counseled on importance for colon cancer screening. TDaP:  2017  Gardasil: n/a Body mass index is 27.12 kg/m. Referral to dermatology placed for body mole check Offered calcium CT cardiac score test     05/15/2024    7:58 AM 02/07/2024    3:31 PM 12/22/2023   10:10 AM  Depression screen PHQ 2/9  Decreased Interest 0 0 0  Down, Depressed, Hopeless 0 0 1  PHQ - 2 Score 0 0 1  Altered sleeping  0 2  Tired, decreased energy  1 2  Change in appetite  0 2  Feeling bad or failure about yourself   0 0  Trouble concentrating  0 1  Moving slowly or fidgety/restless  0 0  Suicidal thoughts  0 0  PHQ-9 Score  1  8   Difficult doing work/chores  Not difficult at all Somewhat difficult     Data saved with a previous flowsheet row definition    Blood pressure 126/78, pulse 87, height 5' 4 (1.626 m), weight 158 lb (71.7 kg), SpO2 99%.     Component Value Date/Time   DIAGPAP  04/28/2023 9167    - Negative for intraepithelial lesion or malignancy (NILM)   DIAGPAP  03/17/2018 0000    NEGATIVE FOR INTRAEPITHELIAL LESIONS OR  MALIGNANCY.   HPVHIGH Negative 04/28/2023 0832   ADEQPAP  04/28/2023 0832    Satisfactory for evaluation; transformation zone component PRESENT.   ADEQPAP  03/17/2018 0000    Satisfactory for evaluation  endocervical/transformation zone component PRESENT.    GYN HISTORY:    Component Value Date/Time   DIAGPAP  04/28/2023 0832    - Negative for intraepithelial lesion or malignancy (NILM)   DIAGPAP  03/17/2018 0000    NEGATIVE FOR INTRAEPITHELIAL LESIONS OR MALIGNANCY.   HPVHIGH Negative 04/28/2023 0832   ADEQPAP  04/28/2023 0832    Satisfactory for evaluation; transformation zone component PRESENT.   ADEQPAP  03/17/2018 0000    Satisfactory for evaluation  endocervical/transformation zone component PRESENT.    OB History  Gravida Para Term Preterm AB Living  1 1 1   1   SAB IAB Ectopic Multiple Live Births      1    # Outcome Date GA Lbr Len/2nd Weight Sex Type Anes PTL Lv  1 Term     F Vag-Spont   LIV    Past Medical History:  Diagnosis Date   Abnormal Pap smear    Hypertension  not currently on meds    Past Surgical History:  Procedure Laterality Date   CERVIX LESION DESTRUCTION     ?   DILATATION & CURETTAGE/HYSTEROSCOPY WITH MYOSURE N/A 04/21/2015   Procedure: DILATATION & CURETTAGE/HYSTEROSCOPY WITH MYOSURE;  Surgeon: Kate Hargis Nearing, MD;  Location: [facility];  Service: Gynecology;  Laterality: N/A;   EYE SURGERY     INTRAUTERINE DEVICE (IUD) INSERTION  04/2016   VULVA SURGERY  07/20/10   bilateral revision labia minora    Medications Ordered Prior to Encounter[1]  Social History   Socioeconomic History   Marital status: Married    Spouse name: Not on file   Number of children: Not on file   Years of education: Not on file   Highest education level: 12th grade  Occupational History   Not on file  Tobacco Use   Smoking status: Never   Smokeless tobacco: Never  Vaping Use   Vaping status: Never Used  Substance and Sexual Activity   Alcohol use:  Yes    Alcohol/week: 1.0 standard drink of alcohol    Types: 1 Standard drinks or equivalent per week   Drug use: No   Sexual activity: Yes    Partners: Male    Birth control/protection: I.U.D.  Other Topics Concern   Not on file  Social History Narrative   Not on file   Social Drivers of Health   Tobacco Use: Low Risk (05/15/2024)   Patient History    Smoking Tobacco Use: Never    Smokeless Tobacco Use: Never    Passive Exposure: Not on file  Financial Resource Strain: Low Risk (12/13/2023)   Overall Financial Resource Strain (CARDIA)    Difficulty of Paying Living Expenses: Not very hard  Food Insecurity: No Food Insecurity (12/13/2023)   Epic    Worried About Radiation Protection Practitioner of Food in the Last Year: Never true    Ran Out of Food in the Last Year: Never true  Transportation Needs: No Transportation Needs (12/13/2023)   Epic    Lack of Transportation (Medical): No    Lack of Transportation (Non-Medical): No  Physical Activity: Sufficiently Active (12/13/2023)   Exercise Vital Sign    Days of Exercise per Week: 6 days    Minutes of Exercise per Session: 60 min  Stress: Stress Concern Present (12/13/2023)   [employer] of Occupational Health - Occupational Stress Questionnaire    Feeling of Stress: Very much  Social Connections: Socially Integrated (12/13/2023)   Social Connection and Isolation Panel    Frequency of Communication with Friends and Family: Three times a week    Frequency of Social Gatherings with Friends and Family: Once a week    Attends Religious Services: More than 4 times per year    Active Member of Clubs or Organizations: Yes    Attends Banker Meetings: More than 4 times per year    Marital Status: Married  Catering Manager Violence: Unknown (08/12/2021)   Received from [facility]   HITS    Physically Hurt: Not on file    Insult or Talk Down To: Not on file    Threaten Physical Harm: Not on file    Scream or Curse: Not on file  Depression  (PHQ2-9): Low Risk (05/15/2024)   Depression (PHQ2-9)    PHQ-2 Score: 0  Alcohol Screen: Low Risk (12/13/2023)   Alcohol Screen    Last Alcohol Screening Score (AUDIT): 2  Housing: Unknown (12/13/2023)   Epic    Unable  to Pay for Housing in the Last Year: No    Number of Times Moved in the Last Year: Not on file    Homeless in the Last Year: No  Utilities: Not on file  Health Literacy: Not on file    Family History  Problem Relation Age of Onset   Heart disease Mother    Cirrhosis Mother    Aneurysm Father    Diabetes Maternal Aunt    Heart disease Maternal Grandmother        pacemaker   Stroke Maternal Grandmother    Lung cancer Paternal Grandfather    Thyroid  disease Neg Hx    Breast cancer Neg Hx      Allergies[2]    Patient's last menstrual period was No LMP recorded. (Menstrual status: IUD)..            Review of Systems Alls systems reviewed and are negative.     Physical Exam Constitutional:      Appearance: Normal appearance.  Genitourinary:     Vulva and urethral meatus normal.     No lesions in the vagina.     Genitourinary Comments: Cervix with obvious prior procedure     Right Labia: No rash, lesions or skin changes.    Left Labia: No lesions, skin changes or rash.    No vaginal discharge or tenderness.     No vaginal prolapse present.    Moderate vaginal atrophy present.     Right Adnexa: not tender, not palpable and no mass present.    Left Adnexa: not tender, not palpable and no mass present.    No cervical motion tenderness or discharge.     IUD strings visualized.     Uterus is not enlarged, tender or irregular.  Breasts:    Right: Normal.     Left: Normal.  HENT:     Head: Normocephalic.  Neck:     Thyroid : No thyroid  mass, thyromegaly or thyroid  tenderness.  Cardiovascular:     Rate and Rhythm: Normal rate and regular rhythm.     Heart sounds: Normal heart sounds, S1 normal and S2 normal.  Pulmonary:     Effort: Pulmonary effort is  normal.     Breath sounds: Normal breath sounds and air entry.  Abdominal:     General: There is no distension.     Palpations: Abdomen is soft. There is no mass.     Tenderness: There is no abdominal tenderness. There is no guarding or rebound.  Musculoskeletal:        General: Normal range of motion.     Cervical back: Full passive range of motion without pain, normal range of motion and neck supple. No tenderness.     Right lower leg: No edema.     Left lower leg: No edema.  Neurological:     Mental Status: She is alert.  Skin:    General: Skin is warm.  Psychiatric:        Mood and Affect: Mood normal.        Behavior: Behavior normal.        Thought Content: Thought content normal.  Vitals and nursing note reviewed. Exam conducted with a chaperone present.       A:         Well Woman GYN exam IUD removal H/o cryo procedure  P:        Pap smear collected today Encouraged annual mammogram screening Colon cancer screening referral placed today DXA ordered today Labs and immunizations ordered today Discussed breast self exams Encouraged healthy lifestyle practices Encouraged Vit D and Calcium  IUD removed intact today. To get [facility] and estradiol  Intrarosa  sent for atarophic vaginitis and dyspareunia No follow-ups on file.  Carla Woodard     [1]  Current Outpatient Medications on File Prior to Visit  Medication Sig Dispense Refill   busPIRone  (BUSPAR ) 5 MG tablet TAKE 1 TABLET BY MOUTH THREE TIMES A DAY AS NEEDED (Patient taking differently: Take 5 mg by mouth 1 day or 1 dose.) 270 tablet 0   escitalopram  (LEXAPRO ) 10 MG tablet TAKE 1 TABLET BY MOUTH EVERY DAY 90 tablet 0   hydrochlorothiazide  (HYDRODIURIL ) 25 MG tablet Take 1 tablet (25 mg total) by mouth daily. 90 tablet 3   levonorgestrel  (MIRENA ) 20 MCG/24HR IUD 1 each by Intrauterine route once.     phentermine  (ADIPEX-P ) 37.5 MG tablet Take 1 tablet (37.5 mg total) by mouth  daily before breakfast. 30 tablet 2   COLLAGEN PO Take by mouth. (Patient not taking: Reported on 05/15/2024)     No current facility-administered medications on file prior to visit.  [2]  Allergies Allergen Reactions   Latex Rash   "

## 2024-05-15 NOTE — Patient Instructions (Signed)
 Perimenopause/Menopause suggestions   You should be getting 1,200 mg of calcium a day between your diet and supplements and at least 3000 IU a day of Vit D. You should exercise regularly with weight bearing exercises. I would recommend a bone density q 2 years.  We loose 5% per year in this time period of our bone density, due to the loss of estrogen. Magnesium at bedtime for our sleep and bones (follow recommended dose on bottle)  Please read The New Menopause my Dr. Ronal Estefana Morton another great book is Estrogen Matters  Try to avoid caffeine and alcohol in this period, as they can make symptoms worse  Continue annual mammograms, unless told sooner  Please reach out with any questions Almarie MARLA Carpen

## 2024-05-17 LAB — CYTOLOGY - PAP: Diagnosis: NEGATIVE

## 2024-05-18 ENCOUNTER — Telehealth: Payer: Self-pay

## 2024-05-18 NOTE — Telephone Encounter (Signed)
 As per [facility]; regarding the Intrarosa  6.5 mg inserts:  Standard Formulary Medical Necessity Menopausal Agents, Vaginal CMK STD 06-2022 **CAS WEB** ePA Review (DOC); QSet has denial reasons loaded ccj [id number]  The patient's drug benefit plan provides coverage for other drugs which may be considered for treating your patient. Can your patient be treated with a formulary drug? Available Formulary Alternatives: estradiol  vaginal cream, Imvexxy , Vagifem  [NOTE: If yes, provide your patient with a new prescription for the formulary product.]  *Notes from 05/15/24 (AEX) Encounter sent to plan via CMM on 05/18/24.

## 2024-05-25 ENCOUNTER — Ambulatory Visit: Payer: Self-pay | Admitting: Family

## 2024-05-25 ENCOUNTER — Encounter: Payer: Self-pay | Admitting: Family

## 2024-05-25 VITALS — BP 127/75 | HR 69 | Temp 97.2°F | Ht 64.0 in | Wt 155.8 lb

## 2024-05-25 DIAGNOSIS — Z713 Dietary counseling and surveillance: Secondary | ICD-10-CM

## 2024-05-25 DIAGNOSIS — F411 Generalized anxiety disorder: Secondary | ICD-10-CM

## 2024-05-25 DIAGNOSIS — E663 Overweight: Secondary | ICD-10-CM | POA: Insufficient documentation

## 2024-05-25 DIAGNOSIS — F5101 Primary insomnia: Secondary | ICD-10-CM

## 2024-05-25 DIAGNOSIS — I10 Essential (primary) hypertension: Secondary | ICD-10-CM | POA: Diagnosis not present

## 2024-05-25 LAB — CMP14+EGFR
ALT: 18 IU/L (ref 0–32)
AST: 30 IU/L (ref 0–40)
Albumin: 4.6 g/dL (ref 3.8–4.9)
Alkaline Phosphatase: 72 IU/L (ref 49–135)
BUN/Creatinine Ratio: 9 (ref 9–23)
BUN: 8 mg/dL (ref 6–24)
Bilirubin Total: 0.4 mg/dL (ref 0.0–1.2)
CO2: 23 mmol/L (ref 20–29)
Calcium: 9.9 mg/dL (ref 8.7–10.2)
Chloride: 91 mmol/L — ABNORMAL LOW (ref 96–106)
Creatinine, Ser: 0.85 mg/dL (ref 0.57–1.00)
Globulin, Total: 2.1 g/dL (ref 1.5–4.5)
Glucose: 92 mg/dL (ref 70–99)
Potassium: 4.4 mmol/L (ref 3.5–5.2)
Sodium: 131 mmol/L — ABNORMAL LOW (ref 134–144)
Total Protein: 6.7 g/dL (ref 6.0–8.5)
eGFR: 81 mL/min/1.73

## 2024-05-25 MED ORDER — ESCITALOPRAM OXALATE 10 MG PO TABS: 10.0000 mg | ORAL_TABLET | Freq: Every day | ORAL | 2 refills | Status: AC

## 2024-05-25 MED ORDER — PHENTERMINE HCL 37.5 MG PO TABS: 37.5000 mg | ORAL_TABLET | Freq: Every day | ORAL | 2 refills | Status: AC

## 2024-05-25 NOTE — Progress Notes (Signed)
 "  Subjective:    Patient ID: Carla Woodard, female    DOB: 1970-12-03, 54 y.o.   MRN: [id number]  Chief Complaint  Patient presents with   Medical Management of Chronic Issues   PT presents to the office today for chronic follow up.   Wants to discuss weight loss medication. She reports she 50 mins of exercise 5 days a week  and doing exercise classes 2-3 times a week. She started phentermine  and has lost 31 lb!! Her starting weight was 186 lb.      05/25/2024    8:39 AM 05/15/2024    7:51 AM 02/07/2024    3:25 PM  Last 3 Weights  Weight (lbs) 155 lb 12.8 oz 158 lb 186 lb  Weight (kg) 70.67 kg 71.668 kg 84.369 kg    She is preparing to go to [place].  Hypertension This is a chronic problem. The current episode started more than 1 year ago. The problem has been resolved since onset. The problem is controlled. Associated symptoms include anxiety. Pertinent negatives include no malaise/fatigue, peripheral edema or shortness of breath. Past treatments include diuretics. The current treatment provides moderate improvement.  Anxiety Presents for follow-up visit. Symptoms include excessive worry and nervous/anxious behavior. Patient reports no shortness of breath. Symptoms occur rarely. The severity of symptoms is mild.        Review of Systems  Constitutional:  Negative for malaise/fatigue.  Respiratory:  Negative for shortness of breath.   Psychiatric/Behavioral:  The patient is nervous/anxious.   All other systems reviewed and are negative.   Social History   Socioeconomic History   Marital status: Married    Spouse name: Not on file   Number of children: Not on file   Years of education: Not on file   Highest education level: Associate degree: occupational, scientist, product/process development, or vocational program  Occupational History   Not on file  Tobacco Use   Smoking status: Never   Smokeless tobacco: Never  Vaping Use   Vaping status: Never Used  Substance and Sexual Activity    Alcohol use: Yes    Alcohol/week: 1.0 standard drink of alcohol    Types: 1 Standard drinks or equivalent per week   Drug use: No   Sexual activity: Yes    Partners: Male    Birth control/protection: I.U.D.  Other Topics Concern   Not on file  Social History Narrative   Not on file   Social Drivers of Health   Tobacco Use: Low Risk (05/25/2024)   Patient History    Smoking Tobacco Use: Never    Smokeless Tobacco Use: Never    Passive Exposure: Not on file  Financial Resource Strain: Low Risk (05/21/2024)   Overall Financial Resource Strain (CARDIA)    Difficulty of Paying Living Expenses: Not very hard  Food Insecurity: No Food Insecurity (05/21/2024)   Epic    Worried About Programme Researcher, Broadcasting/film/video in the Last Year: Never true    Ran Out of Food in the Last Year: Never true  Transportation Needs: No Transportation Needs (05/21/2024)   Epic    Lack of Transportation (Medical): No    Lack of Transportation (Non-Medical): No  Physical Activity: Unknown (05/21/2024)   Exercise Vital Sign    Days of Exercise per Week: 6 days    Minutes of Exercise per Session: Not on file  Stress: No Stress Concern Present (05/21/2024)   [employer] of Occupational Health - Occupational Stress Questionnaire    Feeling of  Stress: Only a little  Social Connections: Socially Integrated (05/21/2024)   Social Connection and Isolation Panel    Frequency of Communication with Friends and Family: More than three times a week    Frequency of Social Gatherings with Friends and Family: Once a week    Attends Religious Services: More than 4 times per year    Active Member of [employer] or Organizations: Yes    Attends Banker Meetings: Not on file    Marital Status: Married  Depression (PHQ2-9): Low Risk (05/25/2024)   Depression (PHQ2-9)    PHQ-2 Score: 0  Alcohol Screen: Low Risk (05/21/2024)   Alcohol Screen    Last Alcohol Screening Score (AUDIT): 1  Housing: Unknown (05/21/2024)   Epic     Unable to Pay for Housing in the Last Year: No    Number of Times Moved in the Last Year: 0    Homeless in the Last Year: Not on file  Utilities: Not on file  Health Literacy: Not on file   Family History  Problem Relation Age of Onset   Heart disease Mother    Cirrhosis Mother    Aneurysm Father    Diabetes Maternal Aunt    Heart disease Maternal Grandmother        pacemaker   Stroke Maternal Grandmother    Lung cancer Paternal Grandfather    Thyroid  disease Neg Hx    Breast cancer Neg Hx         Objective:   Physical Exam Vitals reviewed.  Constitutional:      General: She is not in acute distress.    Appearance: She is well-developed.  HENT:     Head: Normocephalic and atraumatic.     Right Ear: Tympanic membrane normal.     Left Ear: Tympanic membrane normal.  Eyes:     Pupils: Pupils are equal, round, and reactive to light.  Neck:     Thyroid : No thyromegaly.  Cardiovascular:     Rate and Rhythm: Normal rate and regular rhythm.     Heart sounds: Normal heart sounds. No murmur heard. Pulmonary:     Effort: Pulmonary effort is normal. No respiratory distress.     Breath sounds: Normal breath sounds. No wheezing.  Abdominal:     General: Bowel sounds are normal. There is no distension.     Palpations: Abdomen is soft.     Tenderness: There is no abdominal tenderness.  Musculoskeletal:        General: No tenderness. Normal range of motion.     Cervical back: Normal range of motion and neck supple.  Skin:    General: Skin is warm and dry.  Neurological:     Mental Status: She is alert and oriented to person, place, and time.     Cranial Nerves: No cranial nerve deficit.     Deep Tendon Reflexes: Reflexes are normal and symmetric.  Psychiatric:        Behavior: Behavior normal.        Thought Content: Thought content normal.        Judgment: Judgment normal.       BP 127/75   Pulse 69   Temp (!) 97.2 F (36.2 C) (Temporal)   Ht 5' 4 (1.626 m)   Wt  155 lb 12.8 oz (70.7 kg)   SpO2 100%   BMI 26.74 kg/m      Assessment & Plan:  Carla Woodard comes in today with chief complaint of Medical  Management of Chronic Issues   Diagnosis and orders addressed:  1. GAD (generalized anxiety disorder) (Primary) - escitalopram  (LEXAPRO ) 10 MG tablet; Take 1 tablet (10 mg total) by mouth daily.  Dispense: 90 tablet; Refill: 2 - CMP14+EGFR  2. Weight loss counseling, encounter for - phentermine  (ADIPEX-P ) 37.5 MG tablet; Take 1 tablet (37.5 mg total) by mouth daily before breakfast.  Dispense: 30 tablet; Refill: 2 - CMP14+EGFR  3. Essential hypertension - CMP14+EGFR  4. Primary insomnia - CMP14+EGFR  5. Overweight (BMI 25.0-29.9) - CMP14+EGFR   Labs pending Will continue phentermine , keep up the great work! Continue current medications  Health Maintenance reviewed Diet and exercise encouraged  Return in about 3 months (around 08/23/2024), or if symptoms worsen or fail to improve.    Bari Learn, FNP   "

## 2024-05-25 NOTE — Patient Instructions (Signed)

## 2024-05-28 ENCOUNTER — Ambulatory Visit: Payer: Self-pay | Admitting: Family

## 2024-08-23 ENCOUNTER — Ambulatory Visit: Admitting: Family

## 2025-05-21 ENCOUNTER — Ambulatory Visit: Admitting: Obstetrics and Gynecology
